# Patient Record
Sex: Female | Born: 1990 | Race: White | Hispanic: No | Marital: Married | Smoking: Never smoker
Health system: Southern US, Community
[De-identification: ages and names within clinical notes are randomized; demographics above are authoritative.]

## PROBLEM LIST (undated history)

## (undated) HISTORY — PX: OTHER SURGICAL HISTORY: SHX169

## (undated) HISTORY — PX: NO PAST SURGERIES: SHX2092

---

## 2020-06-29 ENCOUNTER — Ambulatory Visit: Admit: 2020-06-29 | Discharge status: Absent without leave | Payer: Self-pay

## 2020-06-29 ENCOUNTER — Ambulatory Visit
Admission: EM | Admit: 2020-06-29 | Discharge: 2020-06-29 | Disposition: A | Discharge status: Home / Self Care | Payer: BC Managed Care – PPO | Attending: Emergency Medicine | Admitting: Emergency Medicine

## 2020-06-29 ENCOUNTER — Encounter: Payer: Self-pay | Admitting: Emergency Medicine

## 2020-06-29 ENCOUNTER — Other Ambulatory Visit: Payer: Self-pay

## 2020-06-29 DIAGNOSIS — J029 Acute pharyngitis, unspecified: Secondary | ICD-10-CM | POA: Insufficient documentation

## 2020-06-29 DIAGNOSIS — Z20822 Contact with and (suspected) exposure to covid-19: Secondary | ICD-10-CM | POA: Diagnosis present

## 2020-06-29 LAB — GROUP A STREP BY PCR: Group A Strep by PCR: NOT DETECTED

## 2020-06-29 LAB — SARS CORONAVIRUS 2 (TAT 6-24 HRS): SARS Coronavirus 2: NEGATIVE

## 2020-06-29 MED ORDER — FLUTICASONE PROPIONATE 50 MCG/ACT NA SUSP: 2.0000 | Freq: Every day | NASAL | 0 refills | Status: DC

## 2020-06-29 MED ORDER — IBUPROFEN 600 MG PO TABS: 600.0000 mg | ORAL_TABLET | Freq: Four times a day (QID) | ORAL | 0 refills | Status: DC | PRN

## 2020-06-29 NOTE — ED Provider Notes (Signed)
HPI  SUBJECTIVE:  Patient reports sore throat starting 1 week ago.  States it is getting worse.. Sx worse with swallowing.  Nothing.  Has been taking ibuprofen 400 mg every 6 hours, drinking hot tea with honey.  States that she is unable to sleep at night secondary to the sore throat.  No fever   No neck stiffness  + Dry cough No nasal congestion, rhinorrhea No Myalgias No Headache No Rash No excess fatigue  No loss of taste or smell No shortness of breath or difficulty breathing No nausea, vomiting No diarrhea No abdominal pain     No Recent Strep, mono, COVID exposure.  She states that her husband has had similar symptoms for the past 2 weeks. No reflux sxs No Allergy sxs  No Breathing difficulty, sensation of throat swelling shut + Raspy voice No Drooling No Trismus No abx in past month.  No antipyretic in past 4-6 hrs Got both doses of the Covid vaccine, last one in April.   Past medical history negative for GERD, allergies, frequent strep, diabetes, hypertension, asthma.  She had mono in college.   LMP: Has an IUD.  Denies possibility pregnant.   PMD: [facility].Marland Kitchen   History reviewed. No pertinent past medical history.  History reviewed. No pertinent surgical history.  Family History  Problem Relation Age of Onset  . Cancer Father     Social History   Tobacco Use  . Smoking status: Never Smoker  . Smokeless tobacco: Never Used  Vaping Use  . Vaping Use: Never used  Substance Use Topics  . Alcohol use: Yes  . Drug use: Never    No current facility-administered medications for this encounter.  Current Outpatient Medications:  .  fluticasone (FLONASE) 50 MCG/ACT nasal spray, Place 2 sprays into both nostrils daily., Disp: 16 g, Rfl: 0 .  ibuprofen (ADVIL) 600 MG tablet, Take 1 tablet (600 mg total) by mouth every 6 (six) hours as needed., Disp: 30 tablet, Rfl: 0  Allergies  Allergen Reactions  . Sulfa Antibiotics     Throat swelling       ROS  As noted in HPI.   Physical Exam  BP 136/80 (BP Location: Right Arm)   Pulse 80   Temp 98.1 F (36.7 C) (Oral)   Resp 18   Ht 5\' 9"  (1.753 m)   Wt 81.6 kg   SpO2 100%   BMI 26.58 kg/m   Constitutional: Well developed, well nourished, no acute distress Eyes:  EOMI, conjunctiva normal bilaterally HENT: Normocephalic, atraumatic,mucus membranes moist.  - nasal congestion + erythematous oropharynx - enlarged tonsils - exudates. Uvula midline.  No petechiae on palate.  Positive postnasal drip Respiratory: Normal inspiratory effort lungs clear bilaterally Cardiovascular: Normal rate, no murmurs, rubs, gallops GI: nondistended, nontender. No appreciable splenomegaly skin: No rash, skin intact Lymph: - Anterior cervical LN.  No posterior cervical lymphadenopathy Musculoskeletal: no deformities Neurologic: Alert & oriented x 3, no focal neuro deficits Psychiatric: Speech and behavior appropriate.    ED Course   Medications - No data to display  Orders Placed This Encounter  Procedures  . SARS CORONAVIRUS 2 (TAT 6-24 HRS) Nasopharyngeal Nasopharyngeal Swab    Standing Status:   Standing    Number of Occurrences:   1    Order Specific Question:   Is this test for diagnosis or screening    Answer:   Diagnosis of ill patient    Order Specific Question:   Symptomatic for COVID-19 as defined  by CDC    Answer:   Yes    Order Specific Question:   Date of Symptom Onset    Answer:   06/22/2020    Order Specific Question:   Hospitalized for COVID-19    Answer:   No    Order Specific Question:   Admitted to ICU for COVID-19    Answer:   No    Order Specific Question:   Previously tested for COVID-19    Answer:   Yes    Order Specific Question:   Resident in a congregate (group) care setting    Answer:   No    Order Specific Question:   Employed in healthcare setting    Answer:   No    Order Specific Question:   Pregnant    Answer:   No    Order Specific Question:   Has  patient completed COVID vaccination(s) (2 doses of Pfizer/Moderna 1 dose of [employer])    Answer:   Yes  . Group A Strep by PCR    Standing Status:   Standing    Number of Occurrences:   1    Order Specific Question:   Patient immune status    Answer:   Normal   Results for orders placed or performed during the hospital encounter of 06/29/20  SARS CORONAVIRUS 2 (TAT 6-24 HRS) Nasopharyngeal Nasopharyngeal Swab   Specimen: Nasopharyngeal Swab  Result Value Ref Range   SARS Coronavirus 2 NEGATIVE NEGATIVE  Group A Strep by PCR   Specimen: Nasopharyngeal Swab; Sterile Swab  Result Value Ref Range   Group A Strep by PCR NOT DETECTED NOT DETECTED    No results found for this or any previous visit (from the past 24 hour(s)). No results found.  ED Clinical Impression  1. Pharyngitis, unspecified etiology   2. Encounter for laboratory testing for COVID-19 virus      ED Assessment/Plan  Strep PCR negative.  Covid sent.  She will be a candidate for monoclonal antibody infusion based on BMI  Covid negative  Suspect infectious cause given husband has identical symptoms rather than GERD.  Will treat supportively.  Benadryl/Maalox mixture, Flonase, saline nasal irrigation.  Follow-up with PMD as needed.  To the ER if she gets worse.  Discussed labs,  MDM, plan and followup with patient. Discussed sn/sx that should prompt return to the ED. patient agrees with plan.   Meds ordered this encounter  Medications  . fluticasone (FLONASE) 50 MCG/ACT nasal spray    Sig: Place 2 sprays into both nostrils daily.    Dispense:  16 g    Refill:  0  . ibuprofen (ADVIL) 600 MG tablet    Sig: Take 1 tablet (600 mg total) by mouth every 6 (six) hours as needed.    Dispense:  30 tablet    Refill:  0     *This clinic note was created using Scientist, clinical (histocompatibility and immunogenetics). Therefore, there may be occasional mistakes despite careful proofreading.    Domenick Gong, MD 06/30/20 1323

## 2020-06-29 NOTE — ED Triage Notes (Signed)
Patient c/o sore throat that started 1 week ago. She states the pain is keeping her awake at night. She does report a dry cough from when her throat is irritated.

## 2020-06-29 NOTE — Discharge Instructions (Addendum)
Contact you if your Covid comes back positive.  Try Flonase, saline nasal irrigation with a Lloyd Huger med rinse and distilled water as often as you want.  1 gram of Tylenol and 600 mg ibuprofen together 3-4 times a day as needed for pain.  Make sure you drink plenty of extra fluids.  Some people find salt water gargles and  Traditional Medicinal's "Throat Coat" tea helpful. Take 5 mL of liquid Benadryl and 5 mL of Maalox. Mix it together, and then hold it in your mouth for as long as you can and then swallow. You may do this 4 times a day.    Go to www.goodrx.com to look up your medications. This will give you a list of where you can find your prescriptions at the most affordable prices. Or ask the pharmacist what the cash price is, or if they have any other discount programs available to help make your medication more affordable. This can be less expensive than what you would pay with insurance.

## 2020-10-30 NOTE — L&D Delivery Note (Signed)
Delivery Note  Date of delivery: 05/23/2021 Estimated Date of Delivery: 06/03/21 No LMP recorded. Patient is pregnant. EGA: [redacted]w[redacted]d  Delivery Note At 1:58 PM a viable female was delivered via Vaginal, Spontaneous (Presentation: Left Occiput Anterior).  APGAR: 8, 9; weight 8 lb 12.7 oz (3990 g). Placenta status: Spontaneous, Intact.  Cord: 3 vessels with the following complications: None.  Cord pH: n/a   First Stage: Labor onset: 0710 Augmentation : pitocin Analgesia /Anesthesia intrapartum: Epidural SROM at 2330  Carol Frye presented to L&D with SROM. She was augmented with pitocin. Epidural placed for pain relief.   Second Stage: Complete dilation at 1314 Onset of pushing at 1314 FHR second stage Cat II Delivery at 1358 on 05/23/2021  She progressed to complete and had a spontaneous vaginal birth of a live female over an intact perineum. The fetal head was delivered in OA position with restitution to LOA. Loose nuchal cord noted and easiley reduced. Anterior then posterior shoulders delivered spontaneously. Baby placed on mom's abdomen and attended to by transition RN. Cord clamped and cut when pulseless by FOB.   Third Stage: Placenta delivered intact with 3VC at 1404  Placenta disposition: routine disposal Uterine tone firm / bleeding min IV pitocin given for hemorrhage prophylaxis  Anesthesia: Epidural Episiotomy: None Lacerations: 2nd degree;Vaginal;Perineal Suture Repair: 2.0 vicryl Est. Blood Loss (mL): 150  Complications: none  Mom to postpartum.  Baby to Couplet care / Skin to Skin.  Newborn: Birth Weight: 3900g 8lb12.7oz  Apgar Scores: 8, 9 Feeding planned: Breastfeeding   Cyril Mourning, CNM 05/23/2021 2:33 PM

## 2020-11-17 LAB — OB RESULTS CONSOLE HEPATITIS B SURFACE ANTIGEN: Hepatitis B Surface Ag: NEGATIVE

## 2020-11-17 LAB — OB RESULTS CONSOLE RUBELLA ANTIBODY, IGM: Rubella: IMMUNE

## 2020-11-17 LAB — OB RESULTS CONSOLE RPR: RPR: REACTIVE

## 2020-11-17 LAB — OB RESULTS CONSOLE HIV ANTIBODY (ROUTINE TESTING): HIV: NONREACTIVE

## 2020-11-17 LAB — OB RESULTS CONSOLE VARICELLA ZOSTER ANTIBODY, IGG: Varicella: IMMUNE

## 2021-04-30 ENCOUNTER — Observation Stay
Admission: EM | Admit: 2021-04-30 | Discharge: 2021-04-30 | Disposition: A | Discharge status: Home / Self Care | Payer: BC Managed Care – PPO | Attending: Certified Nurse Midwife | Admitting: Certified Nurse Midwife

## 2021-04-30 ENCOUNTER — Other Ambulatory Visit: Payer: Self-pay

## 2021-04-30 ENCOUNTER — Encounter: Payer: Self-pay | Admitting: Obstetrics and Gynecology

## 2021-04-30 DIAGNOSIS — Z3A35 35 weeks gestation of pregnancy: Secondary | ICD-10-CM | POA: Diagnosis not present

## 2021-04-30 DIAGNOSIS — O26893 Other specified pregnancy related conditions, third trimester: Secondary | ICD-10-CM | POA: Diagnosis present

## 2021-04-30 DIAGNOSIS — R109 Unspecified abdominal pain: Secondary | ICD-10-CM | POA: Diagnosis not present

## 2021-04-30 DIAGNOSIS — Z3490 Encounter for supervision of normal pregnancy, unspecified, unspecified trimester: Secondary | ICD-10-CM

## 2021-04-30 DIAGNOSIS — O4290 Premature rupture of membranes, unspecified as to length of time between rupture and onset of labor, unspecified weeks of gestation: Secondary | ICD-10-CM | POA: Diagnosis present

## 2021-04-30 LAB — WET PREP, GENITAL
Clue Cells Wet Prep HPF POC: NONE SEEN
Sperm: NONE SEEN
Trich, Wet Prep: NONE SEEN
Yeast Wet Prep HPF POC: NONE SEEN

## 2021-04-30 LAB — RUPTURE OF MEMBRANE (ROM)PLUS: Rom Plus: NEGATIVE

## 2021-04-30 NOTE — Discharge Summary (Signed)
Patient ID: Carol Frye MRN: [id number] DOB/AGE: 24-Feb-1991 30 y.o.  Admit date: 04/30/2021 Discharge date: 04/30/2021  Admission Diagnoses: 30yo G1P0 at [redacted]w[redacted]d presents for LOF and cramping  Factors complicating pregnancy: 1. Family history of congenital heart defect 2. RPR reactive   Discharge Diagnoses: AROM neg, wet prep neg, no cervical change  Prenatal Procedures: NST  Consults: None  Significant Diagnostic Studies:  Results for orders placed or performed during the hospital encounter of 04/30/21 (from the past 168 hour(s))  Wet prep, genital   Collection Time: 04/30/21 12:05 PM   Specimen: Vaginal  Result Value Ref Range   Yeast Wet Prep HPF POC NONE SEEN NONE SEEN   Trich, Wet Prep NONE SEEN NONE SEEN   Clue Cells Wet Prep HPF POC NONE SEEN NONE SEEN   WBC, Wet Prep HPF POC MANY (A) NONE SEEN   Sperm NONE SEEN   ROM Plus ([facility] only)   Collection Time: 04/30/21 12:05 PM  Result Value Ref Range   Rom Plus NEGATIVE     Treatments: none  Hospital Course:  This is a 30 y.o. G1P0 with IUP at [redacted]w[redacted]d seen for LOF and cramping, noted to have a cervical exam of FT/Long/High.  She was deemed stable for discharge to home with outpatient follow up.  Discharge Physical Exam:  BP 116/74   Pulse (!) 106   Resp 14   General: NAD CV: RRR Pulm: nl effort ABD: s/nd/nt, gravid DVT Evaluation: LE non-ttp, no evidence of DVT on exam.  NST: FHR baseline: 140 bpm Variability: moderate Accelerations: yes Decelerations: none Category/reactivity: reactive  TOCO: irregular  SVE:  Dilation: Fingertip Effacement (%): Thick Cervical Position: Posterior Station: Ballotable Exam by:: Annamary Rummage CNM   Discharge Condition: Stable  Disposition: Discharge disposition: 01-Home or Self Care       Allergies as of 04/30/2021       Reactions   Sulfa Antibiotics    Throat swelling   Mushroom Extract Complex Rash        Medication List     STOP taking these medications     fluticasone 50 MCG/ACT nasal spray Commonly known as: FLONASE   ibuprofen 600 MG tablet Commonly known as: ADVIL       TAKE these medications    prenatal multivitamin Tabs tablet Take 1 tablet by mouth daily at 12 noon.        Follow-up Information     [facility] Follow up.   Why: Keep all scheduled appointments Contact information: [address] [place] [address] [phone]                Signed:  Quillian Quince 04/30/2021 1:27 PM

## 2021-04-30 NOTE — Discharge Instructions (Signed)
Please keep your next scheduled appointment.  You may call the on call provider with questions or concerns.  If you have urgent concerns please go to the nearest emergency department for evaluation.

## 2021-04-30 NOTE — OB Triage Note (Addendum)
Patient noticed a wet spot where she had been sitting around 0845 this am.  She put a pad on and noted that it felt damp but was never soaked.  She denies any bleeding and reports baby is moving well as normal.  ROM plus was negative. Cervix check by provider 0.5/thick/high

## 2021-05-02 ENCOUNTER — Other Ambulatory Visit: Payer: Self-pay

## 2021-05-02 ENCOUNTER — Encounter: Payer: Self-pay | Admitting: Obstetrics and Gynecology

## 2021-05-02 ENCOUNTER — Observation Stay
Admission: EM | Admit: 2021-05-02 | Discharge: 2021-05-02 | Disposition: A | Discharge status: Home / Self Care | Payer: BC Managed Care – PPO | Attending: Certified Nurse Midwife | Admitting: Certified Nurse Midwife

## 2021-05-02 DIAGNOSIS — M545 Low back pain, unspecified: Secondary | ICD-10-CM | POA: Diagnosis not present

## 2021-05-02 DIAGNOSIS — O26893 Other specified pregnancy related conditions, third trimester: Principal | ICD-10-CM | POA: Insufficient documentation

## 2021-05-02 DIAGNOSIS — R102 Pelvic and perineal pain: Secondary | ICD-10-CM | POA: Diagnosis not present

## 2021-05-02 DIAGNOSIS — O479 False labor, unspecified: Secondary | ICD-10-CM | POA: Diagnosis present

## 2021-05-02 DIAGNOSIS — Z3A35 35 weeks gestation of pregnancy: Secondary | ICD-10-CM | POA: Diagnosis not present

## 2021-05-02 LAB — URINALYSIS, COMPLETE (UACMP) WITH MICROSCOPIC
Bilirubin Urine: NEGATIVE
Glucose, UA: NEGATIVE mg/dL
Hgb urine dipstick: NEGATIVE
Ketones, ur: NEGATIVE mg/dL
Nitrite: NEGATIVE
Protein, ur: NEGATIVE mg/dL
Specific Gravity, Urine: 1.004 — ABNORMAL LOW (ref 1.005–1.030)
pH: 7 (ref 5.0–8.0)

## 2021-05-02 LAB — GROUP B STREP BY PCR: Group B strep by PCR: NEGATIVE

## 2021-05-02 MED ORDER — ACETAMINOPHEN 325 MG PO TABS
650.0000 mg | ORAL_TABLET | ORAL | Status: DC | PRN
  Administered 2021-05-02: 650 mg via ORAL
  Filled 2021-05-02: qty 2

## 2021-05-02 NOTE — Discharge Summary (Signed)
Patient ID: Carol Frye MRN: [id number] DOB/AGE: [birth year] 28 y.o.  Admit date: 05/02/2021 Discharge date: 05/02/2021  Admission Diagnoses: 30yo G1P0 at [redacted]w[redacted]d presents for cramping in lower pelvis and lower back.  Factors complicating pregnancy: 1. Family history of congenital heart defect 2. RPR reactive   Discharge Diagnoses: not in active labor  Prenatal Procedures: NST  Consults: None  Significant Diagnostic Studies:  Results for orders placed or performed during the hospital encounter of 05/02/21 (from the past 168 hour(s))  Group B strep by PCR   Collection Time: 05/02/21 12:54 PM   Specimen: Vaginal/Rectal; Genital  Result Value Ref Range   Group B strep by PCR NEGATIVE NEGATIVE  Urinalysis, Complete w Microscopic Urine, Clean Catch   Collection Time: 05/02/21 12:54 PM  Result Value Ref Range   Color, Urine YELLOW (A) YELLOW   APPearance HAZY (A) CLEAR   Specific Gravity, Urine 1.004 (L) 1.005 - 1.030   pH 7.0 5.0 - 8.0   Glucose, UA NEGATIVE NEGATIVE mg/dL   Hgb urine dipstick NEGATIVE NEGATIVE   Bilirubin Urine NEGATIVE NEGATIVE   Ketones, ur NEGATIVE NEGATIVE mg/dL   Protein, ur NEGATIVE NEGATIVE mg/dL   Nitrite NEGATIVE NEGATIVE   Leukocytes,Ua LARGE (A) NEGATIVE   RBC / HPF 0-5 0 - 5 RBC/hpf   WBC, UA 11-20 0 - 5 WBC/hpf   Bacteria, UA MANY (A) NONE SEEN   Squamous Epithelial / LPF 0-5 0 - 5   Mucus PRESENT   Results for orders placed or performed during the hospital encounter of 04/30/21 (from the past 168 hour(s))  Wet prep, genital   Collection Time: 04/30/21 12:05 PM   Specimen: Vaginal  Result Value Ref Range   Yeast Wet Prep HPF POC NONE SEEN NONE SEEN   Trich, Wet Prep NONE SEEN NONE SEEN   Clue Cells Wet Prep HPF POC NONE SEEN NONE SEEN   WBC, Wet Prep HPF POC MANY (A) NONE SEEN   Sperm NONE SEEN   ROM Plus ([facility] only)   Collection Time: 04/30/21 12:05 PM  Result Value Ref Range   Rom Plus NEGATIVE     Treatments: Tylenol  Hospital Course:   This is a 30 y.o. G1P0 with IUP at [redacted]w[redacted]d seen for cramping in lower pelvis and lower back. No leaking of fluid and no bleeding.  She was observed, fetal heart rate monitoring remained reassuring, and she had no signs/symptoms of progressing preterm labor or other maternal-fetal concerns.  Her cervical exam was unchanged from admission.  She was deemed stable for discharge to home with outpatient follow up  Discharge Physical Exam:  BP 122/77   Pulse (!) 102   Temp 98 F (36.7 C) (Oral)   Resp 17   Ht 5\' 9"  (1.753 m)   Wt 103.4 kg   BMI 33.67 kg/m   General: NAD CV: RRR Pulm: nl effort ABD: s/nd/nt, gravid DVT Evaluation: LE non-ttp, no evidence of DVT on exam.  NST: FHR baseline: 120 bpm Variability: moderate Accelerations: yes Decelerations: none Category/reactivity: reactive  TOCO: irregular  SVE:  Dilation: Fingertip Effacement (%): Thick Cervical Position: Posterior Station: Ballotable Exam by:: J.Javeah Loeza, CNM   Discharge Condition: Stable  Disposition:  Discharge disposition: 01-Home or Self Care       Allergies as of 05/02/2021       Reactions   Sulfa Antibiotics    Throat swelling   Mushroom Extract Complex Rash        Medication List     TAKE  these medications    prenatal multivitamin Tabs tablet Take 1 tablet by mouth daily at 12 noon.         SignedHaroldine Laws, CNM 05/02/2021 4:31 PM

## 2021-05-02 NOTE — OB Triage Note (Signed)
Patient presented to L&D with complaints of contractions since Saturday. Patient reports they have gotten more uncomfortable this morning. Denies recent intercourse.  No leaking fluid, no vaginal bleeding and reports normal fetal movement.

## 2021-05-23 ENCOUNTER — Other Ambulatory Visit: Payer: Self-pay

## 2021-05-23 ENCOUNTER — Inpatient Hospital Stay: Payer: BC Managed Care – PPO | Admitting: Certified Registered"

## 2021-05-23 ENCOUNTER — Encounter: Payer: Self-pay | Admitting: Obstetrics and Gynecology

## 2021-05-23 ENCOUNTER — Inpatient Hospital Stay
Admission: EM | Admit: 2021-05-23 | Discharge: 2021-05-25 | DRG: 806 | Disposition: A | Discharge status: Home / Self Care | Payer: BC Managed Care – PPO | Attending: Obstetrics and Gynecology | Admitting: Obstetrics and Gynecology

## 2021-05-23 DIAGNOSIS — D62 Acute posthemorrhagic anemia: Secondary | ICD-10-CM | POA: Diagnosis not present

## 2021-05-23 DIAGNOSIS — Z3A38 38 weeks gestation of pregnancy: Secondary | ICD-10-CM

## 2021-05-23 DIAGNOSIS — O26893 Other specified pregnancy related conditions, third trimester: Secondary | ICD-10-CM | POA: Diagnosis present

## 2021-05-23 DIAGNOSIS — O9081 Anemia of the puerperium: Secondary | ICD-10-CM | POA: Diagnosis not present

## 2021-05-23 DIAGNOSIS — O4290 Premature rupture of membranes, unspecified as to length of time between rupture and onset of labor, unspecified weeks of gestation: Secondary | ICD-10-CM | POA: Diagnosis present

## 2021-05-23 DIAGNOSIS — Z20822 Contact with and (suspected) exposure to covid-19: Secondary | ICD-10-CM | POA: Diagnosis present

## 2021-05-23 LAB — CBC
HCT: 37 % (ref 36.0–46.0)
Hemoglobin: 12.6 g/dL (ref 12.0–15.0)
MCH: 29 pg (ref 26.0–34.0)
MCHC: 34.1 g/dL (ref 30.0–36.0)
MCV: 85.3 fL (ref 80.0–100.0)
Platelets: 210 10*3/uL (ref 150–400)
RBC: 4.34 MIL/uL (ref 3.87–5.11)
RDW: 13.5 % (ref 11.5–15.5)
WBC: 12.5 10*3/uL — ABNORMAL HIGH (ref 4.0–10.5)
nRBC: 0 % (ref 0.0–0.2)

## 2021-05-23 LAB — SAMPLE TO BLOOD BANK

## 2021-05-23 LAB — RESP PANEL BY RT-PCR (FLU A&B, COVID) ARPGX2
Influenza A by PCR: NEGATIVE
Influenza B by PCR: NEGATIVE
SARS Coronavirus 2 by RT PCR: NEGATIVE

## 2021-05-23 MED ORDER — WITCH HAZEL-GLYCERIN EX PADS
1.0000 "application " | MEDICATED_PAD | CUTANEOUS | Status: DC | PRN
  Administered 2021-05-23: 1 via TOPICAL
  Filled 2021-05-23: qty 100

## 2021-05-23 MED ORDER — LIDOCAINE HCL (PF) 1 % IJ SOLN
INTRAMUSCULAR | Status: AC
  Filled 2021-05-23: qty 30

## 2021-05-23 MED ORDER — BUPIVACAINE HCL (PF) 0.25 % IJ SOLN
INTRAMUSCULAR | Status: DC | PRN
  Administered 2021-05-23: 5 mL via EPIDURAL

## 2021-05-23 MED ORDER — ACETAMINOPHEN 325 MG PO TABS
650.0000 mg | ORAL_TABLET | ORAL | Status: DC | PRN
  Administered 2021-05-24 (×2): 650 mg via ORAL
  Filled 2021-05-23 (×2): qty 2

## 2021-05-23 MED ORDER — OXYTOCIN-SODIUM CHLORIDE 30-0.9 UT/500ML-% IV SOLN
1.0000 m[IU]/min | INTRAVENOUS | Status: DC
  Administered 2021-05-23: 1 m[IU]/min via INTRAVENOUS

## 2021-05-23 MED ORDER — SOD CITRATE-CITRIC ACID 500-334 MG/5ML PO SOLN: 30.0000 mL | ORAL | Status: DC | PRN

## 2021-05-23 MED ORDER — ONDANSETRON HCL 4 MG/2ML IJ SOLN
4.0000 mg | Freq: Four times a day (QID) | INTRAMUSCULAR | Status: DC
  Administered 2021-05-23: 4 mg via INTRAVENOUS

## 2021-05-23 MED ORDER — TETANUS-DIPHTH-ACELL PERTUSSIS 5-2.5-18.5 LF-MCG/0.5 IM SUSY: 0.5000 mL | PREFILLED_SYRINGE | Freq: Once | INTRAMUSCULAR | Status: DC

## 2021-05-23 MED ORDER — OXYTOCIN-SODIUM CHLORIDE 30-0.9 UT/500ML-% IV SOLN
INTRAVENOUS | Status: AC
  Filled 2021-05-23: qty 500

## 2021-05-23 MED ORDER — BENZOCAINE-MENTHOL 20-0.5 % EX AERO
1.0000 "application " | INHALATION_SPRAY | CUTANEOUS | Status: DC | PRN
  Administered 2021-05-23: 1 via TOPICAL

## 2021-05-23 MED ORDER — DIPHENHYDRAMINE HCL 25 MG PO CAPS: 25.0000 mg | ORAL_CAPSULE | Freq: Four times a day (QID) | ORAL | Status: DC | PRN

## 2021-05-23 MED ORDER — FENTANYL 2.5 MCG/ML W/ROPIVACAINE 0.15% IN NS 100 ML EPIDURAL (ARMC)
EPIDURAL | Status: DC | PRN
  Administered 2021-05-23: 12 mL/h via EPIDURAL

## 2021-05-23 MED ORDER — OXYCODONE-ACETAMINOPHEN 5-325 MG PO TABS: 2.0000 | ORAL_TABLET | ORAL | Status: DC | PRN

## 2021-05-23 MED ORDER — ONDANSETRON HCL 4 MG/2ML IJ SOLN
INTRAMUSCULAR | Status: AC
  Filled 2021-05-23: qty 2

## 2021-05-23 MED ORDER — LACTATED RINGERS IV SOLN: 500.0000 mL | Freq: Once | INTRAVENOUS | Status: DC

## 2021-05-23 MED ORDER — ONDANSETRON HCL 4 MG/2ML IJ SOLN: 4.0000 mg | INTRAMUSCULAR | Status: DC | PRN

## 2021-05-23 MED ORDER — PHENYLEPHRINE 40 MCG/ML (10ML) SYRINGE FOR IV PUSH (FOR BLOOD PRESSURE SUPPORT): 80.0000 ug | PREFILLED_SYRINGE | INTRAVENOUS | Status: DC | PRN

## 2021-05-23 MED ORDER — DIBUCAINE (PERIANAL) 1 % EX OINT
TOPICAL_OINTMENT | CUTANEOUS | Status: AC
  Filled 2021-05-23: qty 28

## 2021-05-23 MED ORDER — ACETAMINOPHEN 325 MG PO TABS: 650.0000 mg | ORAL_TABLET | ORAL | Status: DC | PRN

## 2021-05-23 MED ORDER — FERROUS SULFATE 325 (65 FE) MG PO TABS
325.0000 mg | ORAL_TABLET | Freq: Two times a day (BID) | ORAL | Status: DC
Start: 2021-05-23 — End: 2021-05-25
  Administered 2021-05-23 – 2021-05-25 (×4): 325 mg via ORAL
  Filled 2021-05-23 (×4): qty 1

## 2021-05-23 MED ORDER — MISOPROSTOL 200 MCG PO TABS
ORAL_TABLET | ORAL | Status: AC
  Filled 2021-05-23: qty 4

## 2021-05-23 MED ORDER — DOCUSATE SODIUM 100 MG PO CAPS
100.0000 mg | ORAL_CAPSULE | Freq: Two times a day (BID) | ORAL | Status: DC
  Administered 2021-05-24 – 2021-05-25 (×3): 100 mg via ORAL
  Filled 2021-05-23 (×4): qty 1

## 2021-05-23 MED ORDER — FENTANYL 2.5 MCG/ML W/ROPIVACAINE 0.15% IN NS 100 ML EPIDURAL (ARMC)
EPIDURAL | Status: AC
  Filled 2021-05-23: qty 100

## 2021-05-23 MED ORDER — LACTATED RINGERS IV SOLN: INTRAVENOUS | Status: DC

## 2021-05-23 MED ORDER — IBUPROFEN 600 MG PO TABS
600.0000 mg | ORAL_TABLET | Freq: Four times a day (QID) | ORAL | Status: DC
Start: 2021-05-23 — End: 2021-05-25
  Administered 2021-05-23 – 2021-05-25 (×7): 600 mg via ORAL
  Filled 2021-05-23 (×7): qty 1

## 2021-05-23 MED ORDER — DIBUCAINE (PERIANAL) 1 % EX OINT
1.0000 "application " | TOPICAL_OINTMENT | CUTANEOUS | Status: DC | PRN
  Filled 2021-05-23: qty 28

## 2021-05-23 MED ORDER — SODIUM CHLORIDE 0.9 % IV SOLN
INTRAVENOUS | Status: DC | PRN
  Administered 2021-05-23: 10 ug via INTRAVENOUS

## 2021-05-23 MED ORDER — OXYCODONE-ACETAMINOPHEN 5-325 MG PO TABS: 1.0000 | ORAL_TABLET | ORAL | Status: DC | PRN

## 2021-05-23 MED ORDER — EPHEDRINE 5 MG/ML INJ
10.0000 mg | INTRAVENOUS | Status: DC | PRN
  Administered 2021-05-23: 10 mg via INTRAVENOUS

## 2021-05-23 MED ORDER — LIDOCAINE HCL (PF) 1 % IJ SOLN
INTRAMUSCULAR | Status: DC | PRN
  Administered 2021-05-23: 3 mL

## 2021-05-23 MED ORDER — OXYCODONE HCL 5 MG PO TABS: 5.0000 mg | ORAL_TABLET | ORAL | Status: DC | PRN

## 2021-05-23 MED ORDER — EPHEDRINE 5 MG/ML INJ
10.0000 mg | INTRAVENOUS | Status: DC | PRN
  Filled 2021-05-23: qty 4

## 2021-05-23 MED ORDER — OXYTOCIN 10 UNIT/ML IJ SOLN
INTRAMUSCULAR | Status: AC
  Filled 2021-05-23: qty 2

## 2021-05-23 MED ORDER — AMMONIA AROMATIC IN INHA
RESPIRATORY_TRACT | Status: AC
  Filled 2021-05-23: qty 10

## 2021-05-23 MED ORDER — WITCH HAZEL-GLYCERIN EX PADS
MEDICATED_PAD | CUTANEOUS | Status: AC
  Filled 2021-05-23: qty 100

## 2021-05-23 MED ORDER — DIPHENHYDRAMINE HCL 50 MG/ML IJ SOLN
12.5000 mg | INTRAMUSCULAR | Status: DC | PRN
Start: 2021-05-23 — End: 2021-05-23

## 2021-05-23 MED ORDER — OXYTOCIN BOLUS FROM INFUSION
333.0000 mL | Freq: Once | INTRAVENOUS | Status: AC
  Administered 2021-05-23: 333 mL via INTRAVENOUS

## 2021-05-23 MED ORDER — LACTATED RINGERS IV BOLUS
500.0000 mL | INTRAVENOUS | Status: DC | PRN
  Administered 2021-05-23: 500 mL via INTRAVENOUS

## 2021-05-23 MED ORDER — OXYCODONE HCL 5 MG PO TABS: 10.0000 mg | ORAL_TABLET | ORAL | Status: DC | PRN

## 2021-05-23 MED ORDER — SIMETHICONE 80 MG PO CHEW: 80.0000 mg | CHEWABLE_TABLET | ORAL | Status: DC | PRN

## 2021-05-23 MED ORDER — COCONUT OIL OIL
1.0000 | TOPICAL_OIL | Status: DC | PRN
Start: 2021-05-23 — End: 2021-05-25

## 2021-05-23 MED ORDER — LIDOCAINE-EPINEPHRINE (PF) 1.5 %-1:200000 IJ SOLN
INTRAMUSCULAR | Status: DC | PRN
Start: 2021-05-23 — End: 2021-05-23
  Administered 2021-05-23: 3 mL via PERINEURAL

## 2021-05-23 MED ORDER — PRENATAL MULTIVITAMIN CH
1.0000 | ORAL_TABLET | Freq: Every day | ORAL | Status: DC
Start: 2021-05-24 — End: 2021-05-25
  Administered 2021-05-24: 1 via ORAL
  Filled 2021-05-23: qty 1

## 2021-05-23 MED ORDER — FENTANYL-BUPIVACAINE-NACL 0.5-0.125-0.9 MG/250ML-% EP SOLN: 12.0000 mL/h | EPIDURAL | Status: DC | PRN

## 2021-05-23 MED ORDER — ONDANSETRON HCL 4 MG PO TABS: 4.0000 mg | ORAL_TABLET | ORAL | Status: DC | PRN

## 2021-05-23 MED ORDER — LIDOCAINE HCL (PF) 1 % IJ SOLN
30.0000 mL | INTRAMUSCULAR | Status: DC | PRN
Start: 2021-05-23 — End: 2021-05-23

## 2021-05-23 MED ORDER — OXYTOCIN-SODIUM CHLORIDE 30-0.9 UT/500ML-% IV SOLN
2.5000 [IU]/h | INTRAVENOUS | Status: DC
  Filled 2021-05-23: qty 500

## 2021-05-23 MED ORDER — OXYTOCIN-SODIUM CHLORIDE 30-0.9 UT/500ML-% IV SOLN: 1.0000 m[IU]/min | INTRAVENOUS | Status: DC

## 2021-05-23 MED ORDER — BENZOCAINE-MENTHOL 20-0.5 % EX AERO
INHALATION_SPRAY | CUTANEOUS | Status: AC
  Filled 2021-05-23: qty 56

## 2021-05-23 MED ORDER — TERBUTALINE SULFATE 1 MG/ML IJ SOLN
0.2500 mg | Freq: Once | INTRAMUSCULAR | Status: DC | PRN
  Filled 2021-05-23: qty 1

## 2021-05-23 NOTE — OB Triage Note (Signed)
Patient here for LOF, felt large gush at 1120pm, denies bleeding, reports some cramping and pressure.

## 2021-05-23 NOTE — H&P (Signed)
OB History & Physical   History of Present Illness:  Chief Complaint:   HPI:  Carol Frye is a 30 y.o. G1P0 female at [redacted]w[redacted]d dated by LMP of 08/27/20.  She presents to L&D for leaking fluid.  She reports:  -active fetal movement -LOF/SROM at 2320 -no vaginal bleeding -onset of contractions at 2330 currently every 2-7 minutes  Pregnancy Issues: 1. Family history of congenital heart defect 2. RPR reactive   Ratio 1:1, T. Pallidum non-reactive   03/09/21: RPR: non- reactive   Maternal Medical History:  History reviewed. No pertinent past medical history.  Past Surgical History:  Procedure Laterality Date   NO PAST SURGERIES     OTHER SURGICAL HISTORY     patient was put to sleep for suture repair of face after dog attack at age of 7 years    Allergies  Allergen Reactions   Sulfa Antibiotics     Throat swelling    Mushroom Extract Complex Rash    Prior to Admission medications   Medication Sig Start Date End Date Taking? Authorizing Provider  Prenatal Vit-Fe Fumarate-FA (PRENATAL MULTIVITAMIN) TABS tablet Take 1 tablet by mouth daily at 12 noon.   Yes [provider]     Prenatal care site: [facility]  Social History: She  reports that she has never smoked. She has never used smokeless tobacco. She reports current alcohol use. She reports that she does not use drugs.  Family History: family history includes Cancer in her father.   Review of Systems: A full review of systems was performed and negative except as noted in the HPI.    Physical Exam:  Vital Signs: BP 122/73   Pulse 79   Temp 97.9 F (36.6 C) (Oral)   Resp 18   Ht 5\' 9"  (1.753 m)   Wt 109.3 kg   SpO2 100%   BMI 35.59 kg/m   General:   alert and cooperative  Skin:  normal  Neurologic:    Alert & oriented x 3  Lungs:    Nl effort  Heart:   regular rate and rhythm  Abdomen:  soft, non-tender; bowel sounds normal; no masses,  no organomegaly  Extremities: : non-tender,  symmetric, min edema bilaterally.       Results for orders placed or performed during the hospital encounter of 05/23/21 (from the past 24 hour(s))  Resp Panel by RT-PCR (Flu A&B, Covid) Nasopharyngeal Swab     Status: None   Collection Time: 05/23/21 12:33 AM   Specimen: Nasopharyngeal Swab; Nasopharyngeal(NP) swabs in vial transport medium  Result Value Ref Range   SARS Coronavirus 2 by RT PCR NEGATIVE NEGATIVE   Influenza A by PCR NEGATIVE NEGATIVE   Influenza B by PCR NEGATIVE NEGATIVE  CBC     Status: Abnormal   Collection Time: 05/23/21 12:33 AM  Result Value Ref Range   WBC 12.5 (H) 4.0 - 10.5 K/uL   RBC 4.34 3.87 - 5.11 MIL/uL   Hemoglobin 12.6 12.0 - 15.0 g/dL   HCT 05/25/21 21.1 - 94.1 %   MCV 85.3 80.0 - 100.0 fL   MCH 29.0 26.0 - 34.0 pg   MCHC 34.1 30.0 - 36.0 g/dL   RDW 74.0 81.4 - 48.1 %   Platelets 210 150 - 400 K/uL   nRBC 0.0 0.0 - 0.2 %  ABO/Rh     Status: None   Collection Time: 05/23/21 12:33 AM  Result Value Ref Range   ABO/RH(D)  A POS Performed at [facility], [address] [place], [place] [address]     Pertinent Results:  Prenatal Labs: Blood type/Rh A pos  Antibody screen neg  Rubella Immune  Varicella Immune  RPR RPR reactive - ratio 1:1, T. Pallidium non-reactive, 03/09/21: RPR: non- reactive  HBsAg Neg  HIV NR  GC neg  Chlamydia neg  Genetic screening negative  1 hour GTT 158  3 hour GTT 87-186-150-130  GBS Neg   FHT: FHR: 135 bpm, variability: moderate,  accelerations:  Present,  decelerations:  Absent Category/reactivity:  Category I TOCO: irregular, every 2-7 minutes SVE: Dilation: 4 / Effacement (%): 60 / Station: -2     Assessment:  Carol Frye is a 30 y.o. G1P0 female at [redacted]w[redacted]d with leaking fluid.   Plan:  1. Admit to Labor & Delivery; consents reviewed and obtained  2. Fetal Well being  - Fetal Tracing: Cat I - GBSneg - Presentation: VTX confirmed by SVE   3. Routine OB: - Prenatal labs reviewed, as  above - Rh pos - CBC & T&S on admit - Clear fluids, IVF  4. Induction of Labor -  Contractions by external toco in place -  Plan for induction with Pitocin -  Plan for continuous fetal monitoring  -  Maternal pain control as desired: IVPM, nitrous, regional anesthesia - Anticipate vaginal delivery  5. Post Partum Planning: - Infant feeding: Breastfeeding - Contraception: Condoms - Tdap Given 03/09/21 - Flu  Given 12/15/2020  Carol Frye, CNM 05/23/2021 8:23 AM

## 2021-05-23 NOTE — Discharge Summary (Signed)
Obstetrical Discharge Summary  Patient Name: Carol Frye DOB: 1990/11/06 MRN: [id number]  Date of Admission: 05/23/2021 Date of Delivery: 05/23/21 Delivered by: Annamary Rummage CNM Date of Discharge: 05/25/2021  Primary OB: [facility] LMP:No LMP recorded. EDC Estimated Date of Delivery: 06/03/21 Gestational Age at Delivery: [redacted]w[redacted]d   Antepartum complications: None Admitting Diagnosis: SROM Secondary Diagnosis: Patient Active Problem List   Diagnosis Date Noted   Indication for care in labor or delivery 05/23/2021   Uterine contractions 05/02/2021   Amniotic fluid leaking 04/30/2021   Pregnancy 04/30/2021    Augmentation: Pitocin Complications: None  Intrapartum complications/course: see delivery note Delivery Type: spontaneous vaginal delivery Anesthesia: epidural Placenta: spontaneous Laceration: 2nd deg lac Episiotomy: none Newborn Data: Live born female "Carol Frye" Birth Weight:  8#13 APGAR: 8, 9  Newborn Delivery   Birth date/time: 05/23/2021 13:58:00 Delivery type: Vaginal, Spontaneous      30 y.o. G1P0 at [redacted]w[redacted]d presenting with LOF, SROM with clear fluid.  She progressed to complete and pushed over an intact perineum and delivered the fetal head, followed promptly by the shoulders. She was in control the whole time, and the baby placed on the maternal abdomen. Delayed cord clamping and the FOB cut the baby's cord, while the baby was skin to skin. The placenta delivered spontaneously and intact. 2nd degree laceration that was repaired in the standard fashion. Mom and baby tolerated the procedure well.   Postpartum Procedures: None  Post partum course:  Patient had an uncomplicated postpartum course.  By time of discharge on PPD#2, her pain was controlled on oral pain medications; she had appropriate lochia and was ambulating, voiding without difficulty and tolerating regular diet.  She was deemed stable for discharge to home.    Discharge Physical Exam:  BP 114/67    Pulse 68   Temp 98.2 F (36.8 C) (Oral)   Resp 20   Ht 5\' 9"  (1.753 m)   Wt 109.3 kg   SpO2 99%   Breastfeeding Unknown   BMI 35.59 kg/m   General: alert and no distress Pulm: normal respiratory effort Lochia: appropriate Abdomen: soft, NT Uterine Fundus: firm, below umbilicus Extremities: No evidence of DVT seen on physical exam. No lower extremity edema.  Edinburgh Postnatal Depression Scale Screening Tool 05/23/2021 05/23/2021  I have been able to laugh and see the funny side of things. 0 (No Data)  I have looked forward with enjoyment to things. 0 -  I have blamed myself unnecessarily when things went wrong. 1 -  I have been anxious or worried for no good reason. 0 -  I have felt scared or panicky for no good reason. 0 -  Things have been getting on top of me. 0 -  I have been so unhappy that I have had difficulty sleeping. 0 -  I have felt sad or miserable. 0 -  I have been so unhappy that I have been crying. 0 -  The thought of harming myself has occurred to me. 0 -  Edinburgh Postnatal Depression Scale Total 1 -     Labs: CBC Latest Ref Rng & Units 05/24/2021 05/23/2021  WBC 4.0 - 10.5 K/uL 13.3(H) 12.5(H)  Hemoglobin 12.0 - 15.0 g/dL 10.8(L) 12.6  Hematocrit 36.0 - 46.0 % 31.7(L) 37.0  Platelets 150 - 400 K/uL 148(L) 210   A POS  Hemoglobin  Date Value Ref Range Status  05/24/2021 10.8 (L) 12.0 - 15.0 g/dL Final   HCT  Date Value Ref Range Status  05/24/2021 31.7 (L) 36.0 - 46.0 % Final    Disposition: stable, discharge to home Baby Feeding: breastmilk Baby Disposition: home with mom  Contraception: condoms  Prenatal Labs:  Blood type/Rh A pos  Antibody screen neg  Rubella Immune  Varicella Immune  RPR RPR reactive - ratio 1:1, T. Pallidium non-reactive, 03/09/21: RPR: non- reactive  HBsAg Neg  HIV NR  GC neg  Chlamydia neg  Genetic screening negative  1 hour GTT 158  3 hour GTT 87-186-150-130  GBS Neg   Rh Immune globulin given:  n/a Rubella vaccine given: Immune Varicella vaccine given: Immune Tdap vaccine given in AP or PP setting: Given 03/09/21 Flu vaccine given in AP or PP setting: Given 12/15/2020  Plan: Terrina Docter was discharged to home in good condition. Follow-up appointment with delivering provider in 6 weeks.  Discharge Instructions: Per After Visit Summary. Activity: Advance as tolerated. Pelvic rest for 6 weeks.   Diet: Regular Discharge Medications: Allergies as of 05/25/2021       Reactions   Sulfa Antibiotics    Throat swelling   Mushroom Extract Complex Rash        Medication List     TAKE these medications    prenatal multivitamin Tabs tablet Take 1 tablet by mouth daily at 12 noon.       Outpatient follow up:   Follow-up Information     [facility]. Schedule an appointment as soon as possible for a visit in 6 week(s).   Contact information: [address] [place] [address] [phone]                Signed: Cyril Mourning  05/25/2021 8:10 AM

## 2021-05-23 NOTE — Anesthesia Preprocedure Evaluation (Signed)
Anesthesia Evaluation  Patient identified by MRN, date of birth, ID band Patient awake    History of Anesthesia Complications Negative for: history of anesthetic complications  Airway Mallampati: I  TM Distance: >3 FB     Dental  (+) Teeth Intact   Pulmonary neg pulmonary ROS,           Cardiovascular negative cardio ROS   Rhythm:Regular Rate:Tachycardia     Neuro/Psych negative neurological ROS  negative psych ROS   GI/Hepatic Neg liver ROS, GERD  ,  Endo/Other  negative endocrine ROS  Renal/GU negative Renal ROS  negative genitourinary   Musculoskeletal negative musculoskeletal ROS (+)   Abdominal   Peds negative pediatric ROS (+)  Hematology   Anesthesia Other Findings   Reproductive/Obstetrics (+) Pregnancy                             Anesthesia Physical Anesthesia Plan  ASA: 2  Anesthesia Plan: Epidural   Post-op Pain Management:    Induction:   PONV Risk Score and Plan:   Airway Management Planned:   Additional Equipment:   Intra-op Plan:   Post-operative Plan:   Informed Consent: I have reviewed the patients History and Physical, chart, labs and discussed the procedure including the risks, benefits and alternatives for the proposed anesthesia with the patient or authorized representative who has indicated his/her understanding and acceptance.       Plan Discussed with: Anesthesiologist and Surgeon  Anesthesia Plan Comments:         Anesthesia Quick Evaluation

## 2021-05-23 NOTE — Anesthesia Procedure Notes (Signed)
Epidural Patient location during procedure: OB Start time: 05/23/2021 7:24 AM  Staffing Performed: resident/CRNA   Preanesthetic Checklist Completed: patient identified, IV checked, site marked, risks and benefits discussed, surgical consent, monitors and equipment checked, pre-op evaluation and timeout performed  Epidural Patient position: sitting Prep: ChloraPrep Patient monitoring: heart rate and blood pressure Approach: midline Location: L2-L3 Injection technique: LOR saline  Needle:  Needle type: Hustead  Needle gauge: 17 G Needle length: 9 cm Needle insertion depth: 8 cm Catheter type: closed end flexible Catheter at skin depth: 13 cm Test dose: negative and 2% lidocaine with Epi 1:200 K  Assessment Events: blood not aspirated, injection not painful, no injection resistance, no paresthesia and negative IV test  Additional Notes Pt tolerated well.  Attempt x1.  Pain score pre 8/post 0

## 2021-05-23 NOTE — Progress Notes (Addendum)
RN to bedside at 0545 with K Allred RN. FHR in the 50's. Position changed to left lateral. RN continuously adjusting [place]. Position changed to right lateral at 0546. Pitocin discontinued at 0546. IV fluid bolus initiated at 0547. H Cahoon RN to bedside to assist at [phone] Position changed to hands and knees, SVE by RN at 0549 3.5/60/-1. Scalp stimulation negative. Position changed to left lateral. [place] continuously adjusted. FHR remains in the 80's, Beasley MD notified at [phone] and in route. RN remains at bedside until [phone] with FHR in the 120's. Pt education provided and questions answered by RN.

## 2021-05-23 NOTE — Progress Notes (Signed)
Labor Progress Note  Carol Frye is a 30 y.o. G1P0 at [redacted]w[redacted]d by LMP admitted for rupture of membranes  Subjective: Carol Frye called out with vaginal pressure  Objective: BP (!) 101/56   Pulse (!) 103   Temp 98.9 F (37.2 C) (Oral)   Resp 18   Ht 5\' 9"  (1.753 m)   Wt 109.3 kg   SpO2 99%   BMI 35.59 kg/m   Fetal Assessment: FHT:  FHR: 140 bpm, variability: moderate,  accelerations:  Present,  decelerations:  Absent Category/reactivity:  Category I UC:   regular, every 2-4 minutes SVE:    Dilation: complete  Effacement: 100%  Station:  +1  Consistency: ---  Position: ---  Membrane status:SROM at 2330 Amniotic color: Clear  Labs: Lab Results  Component Value Date   WBC 12.5 (H) 05/23/2021   HGB 12.6 05/23/2021   HCT 37.0 05/23/2021   MCV 85.3 05/23/2021   PLT 210 05/23/2021    Assessment / Plan: Augmentation of labor, progressing well, complete will start practice pushing  Labor: Progressing on Pitocin Preeclampsia:   101/56 Fetal Wellbeing:  Category I Pain Control:  Epidural I/D:   Afebrile, GBS neg, SROM x 14hr Anticipated MOD:  NSVD  Jenifer E Raffi Milstein, CNM 05/23/2021, 1:25 PM

## 2021-05-24 LAB — TYPE AND SCREEN
ABO/RH(D): A POS
Antibody Screen: NEGATIVE

## 2021-05-24 LAB — CBC
HCT: 31.7 % — ABNORMAL LOW (ref 36.0–46.0)
Hemoglobin: 10.8 g/dL — ABNORMAL LOW (ref 12.0–15.0)
MCH: 29.3 pg (ref 26.0–34.0)
MCHC: 34.1 g/dL (ref 30.0–36.0)
MCV: 85.9 fL (ref 80.0–100.0)
Platelets: 148 10*3/uL — ABNORMAL LOW (ref 150–400)
RBC: 3.69 MIL/uL — ABNORMAL LOW (ref 3.87–5.11)
RDW: 13.8 % (ref 11.5–15.5)
WBC: 13.3 10*3/uL — ABNORMAL HIGH (ref 4.0–10.5)
nRBC: 0 % (ref 0.0–0.2)

## 2021-05-24 NOTE — Progress Notes (Signed)
Post Partum Day 1 Subjective: Doing well, no complaints.  Tolerating regular diet, pain with PO meds, voiding and ambulating without difficulty.  No CP SOB Fever,Chills, N/V or leg pain; denies nipple or breast pain, no HA change of vision, RUQ/epigastric pain  Objective: BP 108/69 (BP Location: Right Arm)   Pulse 63   Temp 97.8 F (36.6 C) (Oral)   Resp 18   Ht 5\' 9"  (1.753 m)   Wt 109.3 kg   SpO2 99%   Breastfeeding Unknown   BMI 35.59 kg/m    Physical Exam:  General: NAD Breasts: soft/nontender CV: RRR Pulm: nl effort, CTABL Abdomen: soft, NT, BS x 4 Perineum: minimal edema, intact/lacerations hemostatic/repair well approximated Lochia: small Uterine Fundus: fundus firm and 1 fb below umbilicus DVT Evaluation: no cords, ttp LEs   Recent Labs    05/23/21 0033 05/24/21 0618  HGB 12.6 10.8*  HCT 37.0 31.7*  WBC 12.5* 13.3*  PLT 210 148*    Assessment/Plan: 30 y.o. G1P1001 postpartum day # 1  - Continue routine PP care - Lactation consult prn.  - Discussed contraceptive options including implant, IUDs hormonal and non-hormonal, injection, pills/ring/patch, condoms, and NFP. Planning to use condoms - Acute blood loss anemia - hemodynamically stable and asymptomatic; start po ferrous sulfate BID with stool softeners  - Immunization status: all Imms up to date    Disposition: Does not desire Dc home today.     05/26/21, CNM 05/24/2021  8:55 AM

## 2021-05-24 NOTE — Anesthesia Postprocedure Evaluation (Signed)
Anesthesia Post Note  Patient: Carol Frye  Procedure(s) Performed: AN AD HOC LABOR EPIDURAL  Patient location during evaluation: Mother Baby Anesthesia Type: Epidural Level of consciousness: awake, oriented and awake and alert Pain management: pain level controlled Vital Signs Assessment: post-procedure vital signs reviewed and stable Respiratory status: spontaneous breathing, respiratory function stable and nonlabored ventilation Cardiovascular status: blood pressure returned to baseline and stable Postop Assessment: no headache and no backache Anesthetic complications: no   No notable events documented.   Last Vitals:  Vitals:   05/23/21 2350 05/24/21 0440  BP: 112/64 (!) 105/56  Pulse: 66 80  Resp: 17 17  Temp: 36.6 C 36.6 C  SpO2: 98% 99%    Last Pain:  Vitals:   05/24/21 0615  TempSrc:   PainSc: 1                  Chiropodist

## 2021-05-25 NOTE — Progress Notes (Signed)
Patient discharged home with infant. Discharge instructions, prescriptions and follow up appointment given to and reviewed with patient. Patient verbalized understanding  

## 2021-06-07 ENCOUNTER — Ambulatory Visit: Payer: Self-pay

## 2021-06-07 NOTE — Lactation Note (Signed)
This note was copied from a baby's chart. Lactation Consultation Note  Patient Name: Carol Frye Today's Date: 06/07/2021 Reason for consult: Follow-up assessment;Primapara;Early term 37-38.6wks;Other (Comment) (Difficult latch; Outpatient) Age:28 wk.o.  Outpatient lactation visit for P1 mother with 36 week old baby. Mom was given a nipple shield in hospital due to latching difficulties. Mom states baby was a slow feeder, never truly eager and support thought it may help.  Once discharged feedings have been via bottle, initially of formula, now transitioned into exclusive BM over the past week. Mom desires direct breast feeds as much as possible.   Mom has tried multiple times but states baby will suck 2-3 times and then get frustrated. Tips were given via phone last week from [facility] and mom has attempted these strategies.  Maternal Data Has patient been taught Hand Expression?: Yes Does the patient have breastfeeding experience prior to this delivery?: No  Pumping every 3 hours, at least 7-8x per 24 hours. Mom has adequate milk supply for baby's needs. Everted nipples and compressible tissue.  Feeding Mother's Current Feeding Choice: Breast Milk  Baby currently consuming 2.5-3oz every 3 hours via bottle.  LATCH Score Latch: Repeated attempts needed to sustain latch, nipple held in mouth throughout feeding, stimulation needed to elicit sucking reflex.  Audible Swallowing: Spontaneous and intermittent  Type of Nipple: Everted at rest and after stimulation  Comfort (Breast/Nipple): Soft / non-tender  Hold (Positioning): Assistance needed to correctly position infant at breast and maintain latch.  LATCH Score: 8   Lactation Tools Discussed/Used Tools: Nipple Shields Nipple shield size: 20  Interventions Interventions: Breast feeding basics reviewed;Assisted with latch;Hand express;Adjust position;Support pillows;DEBP;Education  Baby was brought to breast in  cradle hold with nipple shield and some formula (mom did not have EBM with her). Attempts for latching and sustained suck were on/off for 10 minutes. LC was able to hand express into the shield in which the baby would empty and then pull back. LC repositioned mom and brought baby closer to breast. Baby eventually did sustain the latch and was active at breast for 18 minutes with continuous suck, swallow pattern heard. Baby released from breast with relaxed body language, content.  Discharge Discharge Education: Outpatient recommendation Pump: Personal  Mom instructed to attempt feeds at earliest hunger cue (reviewed early cues), options for hand expressing or use of curved tip syringe into shield of EBM, pump post feeding attempt to protect supply.  LC explained importance of consistent exposure to the process, and how baby can re-learn to breastfed.  Reviewed paced bottle feeding for when bottle is given as another tool to transition from bottle back to the breast. Mom encouraged to call throughout the week with questions, steps, reassurance and support.  Lactation to follow-up next Thursday via phone.  Consult Status Consult Status: Complete    Danford Bad 06/07/2021, 2:51 PM

## 2021-07-12 ENCOUNTER — Other Ambulatory Visit: Payer: Self-pay

## 2021-07-12 ENCOUNTER — Emergency Department: Payer: BC Managed Care – PPO

## 2021-07-12 ENCOUNTER — Encounter: Payer: Self-pay | Admitting: Emergency Medicine

## 2021-07-12 ENCOUNTER — Emergency Department
Admission: EM | Admit: 2021-07-12 | Discharge: 2021-07-12 | Disposition: A | Discharge status: Home / Self Care | Payer: BC Managed Care – PPO | Attending: Emergency Medicine | Admitting: Emergency Medicine

## 2021-07-12 DIAGNOSIS — R1011 Right upper quadrant pain: Secondary | ICD-10-CM | POA: Diagnosis present

## 2021-07-12 DIAGNOSIS — R109 Unspecified abdominal pain: Secondary | ICD-10-CM

## 2021-07-12 LAB — COMPREHENSIVE METABOLIC PANEL
ALT: 57 U/L — ABNORMAL HIGH (ref 0–44)
AST: 40 U/L (ref 15–41)
Albumin: 4 g/dL (ref 3.5–5.0)
Alkaline Phosphatase: 89 U/L (ref 38–126)
Anion gap: 11 (ref 5–15)
BUN: 11 mg/dL (ref 6–20)
CO2: 25 mmol/L (ref 22–32)
Calcium: 9.4 mg/dL (ref 8.9–10.3)
Chloride: 101 mmol/L (ref 98–111)
Creatinine, Ser: 0.67 mg/dL (ref 0.44–1.00)
GFR, Estimated: >60 mL/min (ref >60)
Glucose, Bld: 107 mg/dL — ABNORMAL HIGH (ref 70–99)
Potassium: 4.2 mmol/L (ref 3.5–5.1)
Sodium: 137 mmol/L (ref 135–145)
Total Bilirubin: 1.1 mg/dL (ref 0.3–1.2)
Total Protein: 7.5 g/dL (ref 6.5–8.1)

## 2021-07-12 LAB — URINALYSIS, COMPLETE (UACMP) WITH MICROSCOPIC
Bilirubin Urine: NEGATIVE
Glucose, UA: NEGATIVE mg/dL
Hgb urine dipstick: NEGATIVE
Ketones, ur: NEGATIVE mg/dL
Nitrite: NEGATIVE
Protein, ur: NEGATIVE mg/dL
Specific Gravity, Urine: 1.004 — ABNORMAL LOW (ref 1.005–1.030)
pH: 7 (ref 5.0–8.0)

## 2021-07-12 LAB — CBC
HCT: 38.9 % (ref 36.0–46.0)
Hemoglobin: 13.5 g/dL (ref 12.0–15.0)
MCH: 29 pg (ref 26.0–34.0)
MCHC: 34.7 g/dL (ref 30.0–36.0)
MCV: 83.5 fL (ref 80.0–100.0)
Platelets: 268 10*3/uL (ref 150–400)
RBC: 4.66 MIL/uL (ref 3.87–5.11)
RDW: 12.8 % (ref 11.5–15.5)
WBC: 7.8 10*3/uL (ref 4.0–10.5)
nRBC: 0 % (ref 0.0–0.2)

## 2021-07-12 LAB — POC URINE PREG, ED: Preg Test, Ur: NEGATIVE

## 2021-07-12 LAB — LIPASE, BLOOD: Lipase: 36 U/L (ref 11–51)

## 2021-07-12 NOTE — ED Notes (Signed)
Poct Pregnancy Negative 

## 2021-07-12 NOTE — ED Notes (Signed)
Pt states that she had some diarrhea on Sunday and has been having right mid abd pain, pt states that it feels tender but denies vomiting, reports having a baby in July and resumed sexual activity last week and denies any discomfort with that, denies starting her cycle due to breast feeding.

## 2021-07-12 NOTE — ED Triage Notes (Signed)
Pt reports that she has RLQ pain since Sunday, she recently had a baby on 05/23/21. She has had diarrhea and some constipation has lost her appetite also. Moving makes it worse, nothing makes it better.

## 2021-07-12 NOTE — ED Triage Notes (Signed)
First nurse note:  C/O RLQ abdominal pain since Sunday.  AAOx3.  Skin warm and dry. NAD

## 2021-07-12 NOTE — ED Provider Notes (Signed)
[facility] Emergency Department Provider Note  Time seen: 1:38 PM  I have reviewed the triage vital signs and the nursing notes.   HISTORY  Chief Complaint Abdominal Pain   HPI Carol Frye is a 30 y.o. female with a past medical history of NSVD 1.5 months ago who presents to the emergency department for right-sided abdominal pain.  According to the patient since Sunday evening she has been experiencing right-sided abdominal pain that started approximately 1 hour after eating dinner.  Patient states the pain has been fairly constant in the right upper quadrant ever since.  Patient states some loose stool but denies any vomiting.  No dysuria hematuria vaginal bleeding or discharge.  No fever.   History reviewed. No pertinent past medical history.  Patient Active Problem List   Diagnosis Date Noted   Indication for care in labor or delivery 05/23/2021   Uterine contractions 05/02/2021   Amniotic fluid leaking 04/30/2021   Pregnancy 04/30/2021    Past Surgical History:  Procedure Laterality Date   NO PAST SURGERIES     OTHER SURGICAL HISTORY     patient was put to sleep for suture repair of face after dog attack at age of 7 years    Prior to Admission medications   Medication Sig Start Date End Date Taking? Authorizing Provider  Prenatal Vit-Fe Fumarate-FA (PRENATAL MULTIVITAMIN) TABS tablet Take 1 tablet by mouth daily at 12 noon.    [provider]    Allergies  Allergen Reactions   Sulfa Antibiotics     Throat swelling    Mushroom Extract Complex Rash    Family History  Problem Relation Age of Onset   Cancer Father     Social History Social History   Tobacco Use   Smoking status: Never   Smokeless tobacco: Never  Vaping Use   Vaping Use: Never used  Substance Use Topics   Alcohol use: Yes   Drug use: Never    Review of Systems Constitutional: Negative for fever. Cardiovascular: Negative for chest pain. Respiratory:  Negative for shortness of breath. Gastrointestinal: Right-sided abdominal pain.  Positive for diarrhea. Genitourinary: Negative for urinary compaints Musculoskeletal: Negative for musculoskeletal complaints Neurological: Negative for headache All other ROS negative  ____________________________________________   PHYSICAL EXAM:  VITAL SIGNS: ED Triage Vitals  Enc Vitals Group     BP 07/12/21 1129 135/71     Pulse Rate 07/12/21 1129 75     Resp 07/12/21 1129 18     Temp 07/12/21 1129 98 F (36.7 C)     Temp Source 07/12/21 1129 Oral     SpO2 07/12/21 1129 98 %     Weight 07/12/21 1129 210 lb (95.3 kg)     Height 07/12/21 1129 5\' 9"  (1.753 m)     Head Circumference --      Peak Flow --      Pain Score 07/12/21 1138 7     Pain Loc --      Pain Edu? --      Excl. in GC? --    Constitutional: Alert and oriented. Well appearing and in no distress. Eyes: Normal exam ENT      Head: Normocephalic and atraumatic.      Mouth/Throat: Mucous membranes are moist. Cardiovascular: Normal rate, regular rhythm.  Respiratory: Normal respiratory effort without tachypnea nor retractions. Breath sounds are clear Gastrointestinal: Soft, moderate right upper quadrant tenderness palpation with an otherwise benign abdomen including special attention paid to the right lower  quadrant.  No rebound guarding or distention. Musculoskeletal: Nontender with normal range of motion in all extremities.  Neurologic:  Normal speech and language. No gross focal neurologic deficits Skin:  Skin is warm, dry and intact.  Psychiatric: Mood and affect are normal.   ____________________________________________     RADIOLOGY  Ultrasound negative for acute abnormality. CT scan negative for acute abnormality.  ____________________________________________   INITIAL IMPRESSION / ASSESSMENT AND PLAN / ED COURSE  Pertinent labs & imaging results that were available during my care of the patient were reviewed by  me and considered in my medical decision making (see chart for details).   Patient presents emergency department for right upper quadrant abdominal pain.  Pain has been fairly constant x2 days starting approximate 1 hour after eating dinner Sunday night.  Patient has moderate tenderness to the right upper quadrant.  Slight ALT elevation otherwise largely normal LFTs.  Normal white blood cell count.  We will check urinalysis as a precaution.  We will obtain a right upper quadrant ultrasound to further evaluate.  Patient agreeable to plan of care.  Patient's work-up is quite reassuring.  Ultrasound negative for acute abnormality.  CT negative for acute abnormality.  Urinalysis largely within normal limits.  Patient very reassured by the work-up.  We will plan to discharge the patient home with PCP follow-up.  Patient agreeable to plan of care.  I did discuss return precautions as well as surgery follow-up if she continues to have right upper quadrant pain especially after eating.  [name] was evaluated in Emergency Department on 07/12/2021 for the symptoms described in the history of present illness. She was evaluated in the context of the global COVID-19 pandemic, which necessitated consideration that the patient might be at risk for infection with the SARS-CoV-2 virus that causes COVID-19. Institutional protocols and algorithms that pertain to the evaluation of patients at risk for COVID-19 are in a state of rapid change based on information released by regulatory bodies including the CDC and federal and state organizations. These policies and algorithms were followed during the patient's care in the ED.  ____________________________________________   FINAL CLINICAL IMPRESSION(S) / ED DIAGNOSES  Right upper quadrant abdominal pain   Minna Antis, MD 07/12/21 1605

## 2022-08-03 ENCOUNTER — Emergency Department: Payer: BC Managed Care – PPO

## 2022-08-03 ENCOUNTER — Emergency Department
Admission: EM | Admit: 2022-08-03 | Discharge: 2022-08-03 | Disposition: A | Discharge status: Home / Self Care | Payer: BC Managed Care – PPO | Attending: Emergency Medicine | Admitting: Emergency Medicine

## 2022-08-03 ENCOUNTER — Encounter: Payer: Self-pay | Admitting: Emergency Medicine

## 2022-08-03 ENCOUNTER — Other Ambulatory Visit: Payer: Self-pay

## 2022-08-03 DIAGNOSIS — D72829 Elevated white blood cell count, unspecified: Secondary | ICD-10-CM | POA: Insufficient documentation

## 2022-08-03 DIAGNOSIS — Z3A08 8 weeks gestation of pregnancy: Secondary | ICD-10-CM | POA: Diagnosis not present

## 2022-08-03 DIAGNOSIS — O219 Vomiting of pregnancy, unspecified: Secondary | ICD-10-CM | POA: Insufficient documentation

## 2022-08-03 DIAGNOSIS — R109 Unspecified abdominal pain: Secondary | ICD-10-CM

## 2022-08-03 DIAGNOSIS — R1011 Right upper quadrant pain: Secondary | ICD-10-CM | POA: Diagnosis not present

## 2022-08-03 LAB — URINALYSIS, ROUTINE W REFLEX MICROSCOPIC
Bilirubin Urine: NEGATIVE
Glucose, UA: NEGATIVE mg/dL
Hgb urine dipstick: NEGATIVE
Ketones, ur: 80 mg/dL — AB
Nitrite: NEGATIVE
Protein, ur: 30 mg/dL — AB
Specific Gravity, Urine: 1.018 (ref 1.005–1.030)
pH: 8 (ref 5.0–8.0)

## 2022-08-03 LAB — CBC
HCT: 37.2 % (ref 36.0–46.0)
Hemoglobin: 12.8 g/dL (ref 12.0–15.0)
MCH: 29 pg (ref 26.0–34.0)
MCHC: 34.4 g/dL (ref 30.0–36.0)
MCV: 84.2 fL (ref 80.0–100.0)
Platelets: 291 10*3/uL (ref 150–400)
RBC: 4.42 MIL/uL (ref 3.87–5.11)
RDW: 11.6 % (ref 11.5–15.5)
WBC: 10.9 10*3/uL — ABNORMAL HIGH (ref 4.0–10.5)
nRBC: 0 % (ref 0.0–0.2)

## 2022-08-03 LAB — COMPREHENSIVE METABOLIC PANEL WITH GFR
ALT: 14 U/L (ref 0–44)
AST: 19 U/L (ref 15–41)
Albumin: 4.2 g/dL (ref 3.5–5.0)
Alkaline Phosphatase: 76 U/L (ref 38–126)
Anion gap: 10 (ref 5–15)
BUN: 7 mg/dL (ref 6–20)
CO2: 20 mmol/L — ABNORMAL LOW (ref 22–32)
Calcium: 9.3 mg/dL (ref 8.9–10.3)
Chloride: 105 mmol/L (ref 98–111)
Creatinine, Ser: 0.61 mg/dL (ref 0.44–1.00)
GFR, Estimated: >60 mL/min
Glucose, Bld: 117 mg/dL — ABNORMAL HIGH (ref 70–99)
Potassium: 3.9 mmol/L (ref 3.5–5.1)
Sodium: 135 mmol/L (ref 135–145)
Total Bilirubin: 1 mg/dL (ref 0.3–1.2)
Total Protein: 7.9 g/dL (ref 6.5–8.1)

## 2022-08-03 LAB — LIPASE, BLOOD: Lipase: 29 U/L (ref 11–51)

## 2022-08-03 LAB — POC URINE PREG, ED: Preg Test, Ur: POSITIVE — AB

## 2022-08-03 MED ORDER — SODIUM CHLORIDE 0.9 % IV BOLUS
1000.0000 mL | Freq: Once | INTRAVENOUS | Status: AC
  Administered 2022-08-03: 1000 mL via INTRAVENOUS

## 2022-08-03 MED ORDER — CEPHALEXIN 500 MG PO CAPS: 500.0000 mg | ORAL_CAPSULE | Freq: Three times a day (TID) | ORAL | 0 refills | Status: AC

## 2022-08-03 MED ORDER — METOCLOPRAMIDE HCL 5 MG/ML IJ SOLN
10.0000 mg | Freq: Once | INTRAMUSCULAR | Status: AC
  Administered 2022-08-03: 10 mg via INTRAVENOUS
  Filled 2022-08-03: qty 2

## 2022-08-03 MED ORDER — ACETAMINOPHEN 500 MG PO TABS
1000.0000 mg | ORAL_TABLET | Freq: Once | ORAL | Status: AC
  Administered 2022-08-03: 1000 mg via ORAL
  Filled 2022-08-03: qty 2

## 2022-08-03 MED ORDER — PROMETHAZINE HCL 12.5 MG PO TABS: 12.5000 mg | ORAL_TABLET | ORAL | 0 refills | Status: DC | PRN

## 2022-08-03 NOTE — Discharge Instructions (Addendum)
-  Please take the full course of the antibiotics as prescribed.  -You may continue take the promethazine as needed for nausea/vomiting.  You may additionally continue taking acetaminophen at home for the abdominal pain.  Please review the educational material provided.  -Please follow-up with your OB/GYN provider, as discussed.  -Return to the emergency department at any time if you begin to experience any new or worsening symptoms.

## 2022-08-03 NOTE — ED Triage Notes (Signed)
Pt via POV from home. Pt c/o umbilical abd pain, mid back pain started at 8:00pm last night with vomiting. Pt is [redacted] weeks pregnant. States that her urine also has been dark. Denies vaginal bleeding. States she just started phenergan for there NV. Pt is A&Ox4 and NAD.

## 2022-08-03 NOTE — ED Provider Notes (Signed)
[facility] Provider Note    Event Date/Time   First MD Initiated Contact with Patient 08/03/22 0750     (approximate)   History   Chief Complaint Abdominal Pain   HPI Carol Frye is a 31 y.o. female, [redacted] weeks pregnant, presents emergency department for evaluation of abdominal/back pain that started at approximately 8 PM last night associated with multiple episodes of vomiting.  Urine has been dark.  She has taken promethazine for her nausea, though this has not helped much.  She describes her abdominal pain as sharp, localized around the right upper quadrant and periumbilical region radiating to her back.  Pain appears to come and go.  Denies fever/chills, dysuria, chest pain, shortness of breath, flank pain, hematemesis, hematochezia, diarrhea, headache, vision changes, hearing changes, rash/lesions, or dizziness/lightheadedness.  History Limitations: No limitations.        Physical Exam  Triage Vital Signs: ED Triage Vitals  Enc Vitals Group     BP 08/03/22 0727 131/76     Pulse Rate 08/03/22 0727 92     Resp 08/03/22 0727 18     Temp 08/03/22 0727 98 F (36.7 C)     Temp src --      SpO2 08/03/22 0727 94 %     Weight 08/03/22 0727 211 lb (95.7 kg)     Height 08/03/22 0727 5\' 9"  (1.753 m)     Head Circumference --      Peak Flow --      Pain Score 08/03/22 0733 7     Pain Loc --      Pain Edu? --      Excl. in [place]? --     Most recent vital signs: Vitals:   08/03/22 0727  BP: 131/76  Pulse: 92  Resp: 18  Temp: 98 F (36.7 C)  SpO2: 94%    General: Awake, appears uncomfortable. Skin: Warm, dry. No rashes or lesions.  Eyes: PERRL. Conjunctivae normal.  CV: Good peripheral perfusion.  Resp: Normal effort.  Abd: Soft, no distention.  Negative CVAT.  Notable tenderness with deep palpation in the right upper quadrant. Neuro: At baseline. No gross neurological deficits.  Musculoskeletal: Normal ROM of all extremities.  Focused Exam:  N/A.  Physical Exam    ED Results / Procedures / Treatments  Labs (all labs ordered are listed, but only abnormal results are displayed) Labs Reviewed  COMPREHENSIVE METABOLIC PANEL - Abnormal; Notable for the following components:      Result Value   CO2 20 (*)    Glucose, Bld 117 (*)    All other components within normal limits  CBC - Abnormal; Notable for the following components:   WBC 10.9 (*)    All other components within normal limits  URINALYSIS, ROUTINE W REFLEX MICROSCOPIC - Abnormal; Notable for the following components:   Color, Urine YELLOW (*)    APPearance HAZY (*)    Ketones, ur 80 (*)    Protein, ur 30 (*)    Leukocytes,Ua LARGE (*)    Bacteria, UA RARE (*)    All other components within normal limits  POC URINE PREG, ED - Abnormal; Notable for the following components:   Preg Test, Ur POSITIVE (*)    All other components within normal limits  LIPASE, BLOOD  HCG, QUANTITATIVE, PREGNANCY     EKG Sinus rhythm, rate of 63, right axis deviation present, no T-segment changes, no AV blocks, normal QRS, no QT prolongation.    RADIOLOGY  ED Provider Interpretation: I personally viewed and interpreted these images.  Right upper quadrant ultrasound negative.  Obstetric ultrasound negative.  US OB Comp Less 14 Wks  Result Date: 08/03/2022 CLINICAL DATA:  Abdominal pain. EXAM: OBSTETRIC <14 WK ULTRASOUND TECHNIQUE: Transabdominal ultrasound was performed for evaluation of the gestation as well as the maternal uterus and adnexal regions. COMPARISON:  None Available. FINDINGS: Intrauterine gestational sac: Single Yolk sac:  Visualized. Embryo:  Visualized. Cardiac Activity: Visualized. Heart Rate: 166 bpm CRL:   15.8 mm   8 w 0 d                  [place] EDC: Mar 15, 2023. Subchorionic hemorrhage:  None visualized. Maternal uterus/adnexae: Right ovary is unremarkable. Probable corpus luteum cyst seen in left ovary. No free fluid is noted. IMPRESSION: Single live  intrauterine gestation of 8 weeks 0 days. Electronically Signed   By: Lupita Raider M.D.   On: 08/03/2022 09:15   US Abdomen Limited RUQ (LIVER/GB)  Result Date: 08/03/2022 CLINICAL DATA:  Right quadrant pain EXAM: ULTRASOUND ABDOMEN LIMITED RIGHT UPPER QUADRANT COMPARISON:  None Available. FINDINGS: Gallbladder: No gallstones or wall thickening visualized. No sonographic Murphy sign noted by sonographer. Common bile duct: Diameter: 2 mm Liver: No focal lesion identified. Within normal limits in parenchymal echogenicity. Portal vein is patent on color Doppler imaging with normal direction of blood flow towards the liver. Other: None. IMPRESSION: Normal upper quadrant ultrasound. Electronically Signed   By: Allegra Lai M.D.   On: 08/03/2022 09:06    PROCEDURES:  Critical Care performed: N/A.  Procedures    MEDICATIONS ORDERED IN ED: Medications  sodium chloride 0.9 % bolus 1,000 mL (1,000 mLs Intravenous New Bag/Given 08/03/22 0818)  metoCLOPramide (REGLAN) injection 10 mg (10 mg Intravenous Given 08/03/22 0817)  acetaminophen (TYLENOL) tablet 1,000 mg (1,000 mg Oral Given 08/03/22 0817)     IMPRESSION / MDM / ASSESSMENT AND PLAN / ED COURSE  I reviewed the triage vital signs and the nursing notes.                              Differential diagnosis includes, but is not limited to, cholelithiasis, cholecystitis, ureterolithiasis, pyelonephritis, cystitis, hyperemesis gravidarum, gastritis, pancreatitis.  ED Course Patient appears clinically stable, but uncomfortable.  Vitals are within normal limits.  Given history, will treat with IV fluids, metoclopramide, and acetaminophen.  CBC shows mild leukocytosis at 10.9, no anemia.  CMP shows no transaminitis, AKI, or significant electrolyte derangements.  Lipase unremarkable at 29.  Assessment/Plan Patient presents with right upper quadrant/mid abdominal pain x1 day, associated with vomiting.  Peers well.  Vitals are within normal  limits.  Lab work-up shows mild leukocytosis at 10.5, otherwise very unremarkable.  No transaminitis.  She did have some objective right upper quadrant tenderness with deep palpation, however right upper quadrant ultrasound negative.  Transvaginal ultrasound negative as well.  Normal intrauterine pregnancy at 8 weeks.  I suspect that her symptoms are likely related to hyperemesis gravidarum versus possible transient gallstone.  She did state that her pain was mostly relieved by the acetaminophen.  Nausea has been mostly controlled with the metoclopramide.  Her urinalysis does show some large leukocytes and rare bacteria, suggestive of possible early infection.  Given her pregnancy, we will treat her as such.  No signs of hematuria to suggest ureterolithiasis.  We will provide her with a prescription for cephalexin.  Recommend that she  follow-up with her OB/GYN ongoing care.  Offered different antiemetics, however she states that the Phenergan works well for her.  I believe this is reasonable.  Will discharge.  Considered admission for this patient, but given her unremarkable work-up and access to close follow-up, she is unlikely benefit from admission.  Provided the patient with anticipatory guidance, return precautions, and educational material. Encouraged the patient to return to the emergency department at any time if they begin to experience any new or worsening symptoms. Patient expressed understanding and agreed with the plan.   Patient's presentation is most consistent with acute complicated illness / injury requiring diagnostic workup.       FINAL CLINICAL IMPRESSION(S) / ED DIAGNOSES   Final diagnoses:  Right upper quadrant abdominal pain  Nausea/vomiting in pregnancy     Rx / DC Orders   ED Discharge Orders          Ordered    promethazine (PHENERGAN) 12.5 MG tablet  Every 4 hours PRN        08/03/22 1013    cephALEXin (KEFLEX) 500 MG capsule  3 times daily        08/03/22 1025              Note:  This document was prepared using Dragon voice recognition software and may include unintentional dictation errors.   Varney Daily, [place] 08/03/22 1026    Chesley Noon, MD 08/03/22 [phone]

## 2022-08-11 ENCOUNTER — Emergency Department: Payer: BC Managed Care – PPO | Admitting: Anesthesiology

## 2022-08-11 ENCOUNTER — Other Ambulatory Visit: Payer: Self-pay

## 2022-08-11 ENCOUNTER — Emergency Department: Payer: BC Managed Care – PPO

## 2022-08-11 ENCOUNTER — Encounter: Payer: Self-pay | Admitting: Emergency Medicine

## 2022-08-11 ENCOUNTER — Inpatient Hospital Stay
Admission: EM | Admit: 2022-08-11 | Discharge: 2022-08-13 | DRG: 819 | Disposition: A | Discharge status: Home / Self Care | Payer: BC Managed Care – PPO | Attending: Obstetrics and Gynecology | Admitting: Obstetrics and Gynecology

## 2022-08-11 ENCOUNTER — Inpatient Hospital Stay: Payer: BC Managed Care – PPO

## 2022-08-11 ENCOUNTER — Encounter
Admission: EM | Disposition: A | Discharge status: Home / Self Care | Payer: Self-pay | Source: Home / Self Care | Attending: Obstetrics and Gynecology

## 2022-08-11 DIAGNOSIS — O99891 Other specified diseases and conditions complicating pregnancy: Principal | ICD-10-CM | POA: Diagnosis present

## 2022-08-11 DIAGNOSIS — K6389 Other specified diseases of intestine: Secondary | ICD-10-CM | POA: Diagnosis present

## 2022-08-11 DIAGNOSIS — N838 Other noninflammatory disorders of ovary, fallopian tube and broad ligament: Secondary | ICD-10-CM

## 2022-08-11 DIAGNOSIS — R1031 Right lower quadrant pain: Secondary | ICD-10-CM | POA: Diagnosis present

## 2022-08-11 DIAGNOSIS — O211 Hyperemesis gravidarum with metabolic disturbance: Secondary | ICD-10-CM | POA: Diagnosis present

## 2022-08-11 DIAGNOSIS — Z3A09 9 weeks gestation of pregnancy: Secondary | ICD-10-CM | POA: Diagnosis not present

## 2022-08-11 DIAGNOSIS — R1903 Right lower quadrant abdominal swelling, mass and lump: Secondary | ICD-10-CM | POA: Diagnosis present

## 2022-08-11 DIAGNOSIS — O210 Mild hyperemesis gravidarum: Principal | ICD-10-CM

## 2022-08-11 DIAGNOSIS — G8929 Other chronic pain: Secondary | ICD-10-CM | POA: Diagnosis present

## 2022-08-11 DIAGNOSIS — N3000 Acute cystitis without hematuria: Secondary | ICD-10-CM

## 2022-08-11 HISTORY — PX: ROBOTIC ASSISTED LAPAROSCOPIC OVARIAN CYSTECTOMY: SHX6081

## 2022-08-11 LAB — COMPREHENSIVE METABOLIC PANEL
ALT: 26 U/L (ref 0–44)
AST: 19 U/L (ref 15–41)
Albumin: 3.3 g/dL — ABNORMAL LOW (ref 3.5–5.0)
Alkaline Phosphatase: 98 U/L (ref 38–126)
Anion gap: 15 (ref 5–15)
BUN: <5 mg/dL — ABNORMAL LOW (ref 6–20)
CO2: 19 mmol/L — ABNORMAL LOW (ref 22–32)
Calcium: 9.5 mg/dL (ref 8.9–10.3)
Chloride: 100 mmol/L (ref 98–111)
Creatinine, Ser: 0.55 mg/dL (ref 0.44–1.00)
GFR, Estimated: >60 mL/min (ref >60)
Glucose, Bld: 122 mg/dL — ABNORMAL HIGH (ref 70–99)
Potassium: 2.9 mmol/L — ABNORMAL LOW (ref 3.5–5.1)
Sodium: 134 mmol/L — ABNORMAL LOW (ref 135–145)
Total Bilirubin: 1.2 mg/dL (ref 0.3–1.2)
Total Protein: 7.9 g/dL (ref 6.5–8.1)

## 2022-08-11 LAB — URINALYSIS, ROUTINE W REFLEX MICROSCOPIC
Bilirubin Urine: NEGATIVE
Glucose, UA: NEGATIVE mg/dL
Hgb urine dipstick: NEGATIVE
Ketones, ur: 80 mg/dL — AB
Nitrite: NEGATIVE
Protein, ur: 100 mg/dL — AB
Specific Gravity, Urine: 1.026 (ref 1.005–1.030)
pH: 6 (ref 5.0–8.0)

## 2022-08-11 LAB — LIPASE, BLOOD: Lipase: 24 U/L (ref 11–51)

## 2022-08-11 LAB — CBC
HCT: 37.3 % (ref 36.0–46.0)
Hemoglobin: 13 g/dL (ref 12.0–15.0)
MCH: 28.1 pg (ref 26.0–34.0)
MCHC: 34.9 g/dL (ref 30.0–36.0)
MCV: 80.7 fL (ref 80.0–100.0)
Platelets: 338 10*3/uL (ref 150–400)
RBC: 4.62 MIL/uL (ref 3.87–5.11)
RDW: 11.6 % (ref 11.5–15.5)
WBC: 16.5 10*3/uL — ABNORMAL HIGH (ref 4.0–10.5)
nRBC: 0 % (ref 0.0–0.2)

## 2022-08-11 LAB — HCG, QUANTITATIVE, PREGNANCY: hCG, Beta Chain, Quant, S: 91012 m[IU]/mL — ABNORMAL HIGH (ref <5)

## 2022-08-11 LAB — POTASSIUM: Potassium: 3 mmol/L — ABNORMAL LOW (ref 3.5–5.1)

## 2022-08-11 LAB — ABO/RH: ABO/RH(D): A POS

## 2022-08-11 LAB — MAGNESIUM: Magnesium: 1.8 mg/dL (ref 1.7–2.4)

## 2022-08-11 SURGERY — EXCISION, CYST, OVARY, ROBOT-ASSISTED, LAPAROSCOPIC
Anesthesia: General | Site: Abdomen

## 2022-08-11 MED ORDER — DOCUSATE SODIUM 100 MG PO CAPS
100.0000 mg | ORAL_CAPSULE | Freq: Two times a day (BID) | ORAL | Status: DC
  Administered 2022-08-12 – 2022-08-13 (×2): 100 mg via ORAL
  Filled 2022-08-11 (×2): qty 1

## 2022-08-11 MED ORDER — METOCLOPRAMIDE HCL 5 MG/ML IJ SOLN
10.0000 mg | Freq: Once | INTRAMUSCULAR | Status: AC
  Administered 2022-08-11: 10 mg via INTRAVENOUS
  Filled 2022-08-11: qty 2

## 2022-08-11 MED ORDER — FENTANYL CITRATE (PF) 100 MCG/2ML IJ SOLN
INTRAMUSCULAR | Status: AC
  Filled 2022-08-11: qty 2

## 2022-08-11 MED ORDER — PROPOFOL 500 MG/50ML IV EMUL
INTRAVENOUS | Status: DC | PRN
  Administered 2022-08-11: 150 ug/kg/min via INTRAVENOUS

## 2022-08-11 MED ORDER — ONDANSETRON HCL 4 MG PO TABS: 4.0000 mg | ORAL_TABLET | Freq: Four times a day (QID) | ORAL | Status: DC | PRN

## 2022-08-11 MED ORDER — SODIUM CHLORIDE 0.9 % IV BOLUS
1000.0000 mL | Freq: Once | INTRAVENOUS | Status: AC
  Administered 2022-08-11: 1000 mL via INTRAVENOUS

## 2022-08-11 MED ORDER — DEXAMETHASONE SODIUM PHOSPHATE 10 MG/ML IJ SOLN
INTRAMUSCULAR | Status: DC | PRN
  Administered 2022-08-11: 10 mg via INTRAVENOUS

## 2022-08-11 MED ORDER — SIMETHICONE 80 MG PO CHEW: 80.0000 mg | CHEWABLE_TABLET | Freq: Four times a day (QID) | ORAL | Status: DC | PRN

## 2022-08-11 MED ORDER — MIDAZOLAM HCL 2 MG/2ML IJ SOLN
INTRAMUSCULAR | Status: AC
  Filled 2022-08-11: qty 2

## 2022-08-11 MED ORDER — 0.9 % SODIUM CHLORIDE (POUR BTL) OPTIME
TOPICAL | Status: DC | PRN
  Administered 2022-08-11: 500 mL

## 2022-08-11 MED ORDER — ONDANSETRON HCL 4 MG/2ML IJ SOLN
4.0000 mg | Freq: Four times a day (QID) | INTRAMUSCULAR | Status: DC | PRN
  Administered 2022-08-11 – 2022-08-12 (×2): 4 mg via INTRAVENOUS
  Filled 2022-08-11 (×2): qty 2

## 2022-08-11 MED ORDER — PROPOFOL 10 MG/ML IV BOLUS
INTRAVENOUS | Status: AC
  Filled 2022-08-11: qty 20

## 2022-08-11 MED ORDER — DOXYLAMINE SUCCINATE (SLEEP) 25 MG PO TABS
25.0000 mg | ORAL_TABLET | Freq: Two times a day (BID) | ORAL | Status: DC
  Administered 2022-08-12 – 2022-08-13 (×2): 25 mg via ORAL
  Filled 2022-08-11 (×3): qty 1

## 2022-08-11 MED ORDER — PROPOFOL 10 MG/ML IV BOLUS
INTRAVENOUS | Status: DC | PRN
  Administered 2022-08-11: 100 mg via INTRAVENOUS
  Administered 2022-08-11: 200 mg via INTRAVENOUS

## 2022-08-11 MED ORDER — DEXMEDETOMIDINE HCL IN NACL 80 MCG/20ML IV SOLN
INTRAVENOUS | Status: DC | PRN
  Administered 2022-08-11: 8 ug via BUCCAL
  Administered 2022-08-11: 12 ug via BUCCAL

## 2022-08-11 MED ORDER — KCL IN DEXTROSE-NACL 40-5-0.9 MEQ/L-%-% IV SOLN
INTRAVENOUS | Status: DC
  Filled 2022-08-11 (×9): qty 1000

## 2022-08-11 MED ORDER — THIAMINE HCL 100 MG/ML IJ SOLN
Freq: Once | INTRAVENOUS | Status: AC
  Filled 2022-08-11: qty 1000

## 2022-08-11 MED ORDER — PROPOFOL 1000 MG/100ML IV EMUL
INTRAVENOUS | Status: AC
  Filled 2022-08-11: qty 100

## 2022-08-11 MED ORDER — LACTATED RINGERS IV SOLN: INTRAVENOUS | Status: DC

## 2022-08-11 MED ORDER — FENTANYL CITRATE (PF) 100 MCG/2ML IJ SOLN
INTRAMUSCULAR | Status: DC | PRN
  Administered 2022-08-11 (×4): 50 ug via INTRAVENOUS

## 2022-08-11 MED ORDER — FAMOTIDINE IN NACL 20-0.9 MG/50ML-% IV SOLN
20.0000 mg | Freq: Two times a day (BID) | INTRAVENOUS | Status: DC
  Administered 2022-08-12: 20 mg via INTRAVENOUS
  Filled 2022-08-11 (×4): qty 50

## 2022-08-11 MED ORDER — BUPIVACAINE HCL 0.5 % IJ SOLN
INTRAMUSCULAR | Status: DC | PRN
  Administered 2022-08-11: 11 mL

## 2022-08-11 MED ORDER — BUPIVACAINE HCL (PF) 0.5 % IJ SOLN
INTRAMUSCULAR | Status: AC
  Filled 2022-08-11: qty 30

## 2022-08-11 MED ORDER — SUCCINYLCHOLINE CHLORIDE 200 MG/10ML IV SOSY
PREFILLED_SYRINGE | INTRAVENOUS | Status: DC | PRN
  Administered 2022-08-11: 120 mg via INTRAVENOUS

## 2022-08-11 MED ORDER — ONDANSETRON HCL 4 MG/2ML IJ SOLN: 4.0000 mg | Freq: Once | INTRAMUSCULAR | Status: DC | PRN

## 2022-08-11 MED ORDER — SODIUM CHLORIDE 0.9 % IV SOLN
2.0000 g | Freq: Once | INTRAVENOUS | Status: AC
  Administered 2022-08-11: 2 g via INTRAVENOUS
  Filled 2022-08-11: qty 20

## 2022-08-11 MED ORDER — HYDROMORPHONE HCL 1 MG/ML IJ SOLN: 0.2500 mg | INTRAMUSCULAR | Status: DC | PRN

## 2022-08-11 MED ORDER — HYDROCODONE-ACETAMINOPHEN 5-325 MG PO TABS
1.0000 | ORAL_TABLET | ORAL | Status: DC | PRN
  Administered 2022-08-11 – 2022-08-13 (×5): 1 via ORAL
  Filled 2022-08-11 (×5): qty 1

## 2022-08-11 MED ORDER — ONDANSETRON 4 MG PO TBDP
4.0000 mg | ORAL_TABLET | Freq: Once | ORAL | Status: DC
  Filled 2022-08-11: qty 1

## 2022-08-11 MED ORDER — POTASSIUM CHLORIDE 10 MEQ/100ML IV SOLN
10.0000 meq | INTRAVENOUS | Status: AC
  Administered 2022-08-11 (×2): 10 meq via INTRAVENOUS
  Filled 2022-08-11: qty 100

## 2022-08-11 MED ORDER — FENTANYL CITRATE (PF) 100 MCG/2ML IJ SOLN: 25.0000 ug | INTRAMUSCULAR | Status: DC | PRN

## 2022-08-11 MED ORDER — GLYCOPYRROLATE 0.2 MG/ML IJ SOLN
INTRAMUSCULAR | Status: DC | PRN
  Administered 2022-08-11: .6 mg via INTRAVENOUS

## 2022-08-11 MED ORDER — NEOSTIGMINE METHYLSULFATE 10 MG/10ML IV SOLN
INTRAVENOUS | Status: DC | PRN
  Administered 2022-08-11: 5 mg via INTRAVENOUS

## 2022-08-11 MED ORDER — LIDOCAINE HCL (CARDIAC) PF 100 MG/5ML IV SOSY
PREFILLED_SYRINGE | INTRAVENOUS | Status: DC | PRN
  Administered 2022-08-11: 60 mg via INTRAVENOUS
  Administered 2022-08-11: 40 mg via INTRAVENOUS

## 2022-08-11 MED ORDER — MENTHOL 3 MG MT LOZG
1.0000 | LOZENGE | OROMUCOSAL | Status: DC | PRN
  Administered 2022-08-12: 3 mg via ORAL
  Filled 2022-08-11: qty 9

## 2022-08-11 MED ORDER — PYRIDOXINE HCL 25 MG PO TABS
25.0000 mg | ORAL_TABLET | Freq: Two times a day (BID) | ORAL | Status: DC
  Administered 2022-08-12 – 2022-08-13 (×2): 25 mg via ORAL
  Filled 2022-08-11 (×4): qty 1

## 2022-08-11 MED ORDER — ROCURONIUM BROMIDE 100 MG/10ML IV SOLN
INTRAVENOUS | Status: DC | PRN
  Administered 2022-08-11: 60 mg via INTRAVENOUS
  Administered 2022-08-11: 40 mg via INTRAVENOUS

## 2022-08-11 MED ORDER — FAMOTIDINE 20 MG PO TABS
20.0000 mg | ORAL_TABLET | Freq: Two times a day (BID) | ORAL | Status: DC
  Administered 2022-08-13 (×2): 20 mg via ORAL
  Filled 2022-08-11 (×3): qty 1

## 2022-08-11 MED ORDER — DEXTROSE 5 % AND 0.9 % NACL IV BOLUS
1000.0000 mL | Freq: Once | INTRAVENOUS | Status: AC
  Administered 2022-08-11: 1000 mL via INTRAVENOUS

## 2022-08-11 MED ORDER — ONDANSETRON HCL 4 MG/2ML IJ SOLN
INTRAMUSCULAR | Status: DC | PRN
  Administered 2022-08-11 (×2): 4 mg via INTRAVENOUS

## 2022-08-11 SURGICAL SUPPLY — 65 items
BAG URINE DRAIN 2000ML AR STRL (UROLOGICAL SUPPLIES) ×1 IMPLANT
BASIN KIT SINGLE STR (MISCELLANEOUS) ×1 IMPLANT
BLADE SURG SZ11 CARB STEEL (BLADE) ×1 IMPLANT
CATH FOLEY 2WAY  5CC 16FR (CATHETERS) ×1
CATH URTH 16FR FL 2W BLN LF (CATHETERS) ×1 IMPLANT
CHLORAPREP W/TINT 26 (MISCELLANEOUS) ×1 IMPLANT
COVER TIP SHEARS 8 DVNC (MISCELLANEOUS) ×1 IMPLANT
COVER TIP SHEARS 8MM DA VINCI (MISCELLANEOUS) ×1
DEFOGGER SCOPE WARMER CLEARIFY (MISCELLANEOUS) ×1 IMPLANT
DERMABOND ADVANCED .7 DNX12 (GAUZE/BANDAGES/DRESSINGS) ×1 IMPLANT
DRAPE 3/4 80X56 (DRAPES) IMPLANT
DRAPE ARM DVNC X/XI (DISPOSABLE) ×3 IMPLANT
DRAPE DA VINCI XI ARM (DISPOSABLE) ×3
DRAPE LEGGINS SURG 28X43 STRL (DRAPES) ×1 IMPLANT
DRAPE UNDER BUTTOCK W/FLU (DRAPES) ×1 IMPLANT
ELECT REM PT RETURN 9FT ADLT (ELECTROSURGICAL) ×1
ELECTRODE REM PT RTRN 9FT ADLT (ELECTROSURGICAL) ×1 IMPLANT
GLOVE BIO SURGEON STRL SZ7 (GLOVE) ×3 IMPLANT
GLOVE SURG UNDER POLY LF SZ7.5 (GLOVE) ×3 IMPLANT
GOWN STRL REUS W/ TWL LRG LVL3 (GOWN DISPOSABLE) ×6 IMPLANT
GOWN STRL REUS W/TWL LRG LVL3 (GOWN DISPOSABLE) ×6
GRASPER SUT TROCAR 14GX15 (MISCELLANEOUS) IMPLANT
IRRIGATION STRYKERFLOW (MISCELLANEOUS) IMPLANT
IRRIGATOR STRYKERFLOW (MISCELLANEOUS)
IV NS 1000ML (IV SOLUTION)
IV NS 1000ML BAXH (IV SOLUTION) IMPLANT
KIT PINK PAD W/HEAD ARE REST (MISCELLANEOUS) ×1 IMPLANT
KIT PINK PAD W/HEAD ARM REST (MISCELLANEOUS) ×1 IMPLANT
KIT TURNOVER CYSTO (KITS) ×1 IMPLANT
LABEL OR SOLS (LABEL) ×1 IMPLANT
MANIFOLD NEPTUNE II (INSTRUMENTS) ×1 IMPLANT
MANIPULATOR VCARE LG CRV RETR (MISCELLANEOUS) IMPLANT
MANIPULATOR VCARE SML CRV RETR (MISCELLANEOUS) IMPLANT
MANIPULATOR VCARE STD CRV RETR (MISCELLANEOUS) IMPLANT
NEEDLE HYPO 22GX1.5 SAFETY (NEEDLE) ×1 IMPLANT
NS IRRIG 1000ML POUR BTL (IV SOLUTION) ×2 IMPLANT
OBTURATOR OPTICAL STANDARD 8MM (TROCAR) ×1
OBTURATOR OPTICAL STND 8 DVNC (TROCAR) ×1
OBTURATOR OPTICALSTD 8 DVNC (TROCAR) ×1 IMPLANT
OCCLUDER COLPOPNEUMO (BALLOONS) ×1 IMPLANT
PACK LAP CHOLECYSTECTOMY (MISCELLANEOUS) ×1 IMPLANT
PAD ARMBOARD 7.5X6 YLW CONV (MISCELLANEOUS) ×1 IMPLANT
PAD OB MATERNITY 4.3X12.25 (PERSONAL CARE ITEMS) ×1 IMPLANT
PAD PREP 24X41 OB/GYN DISP (PERSONAL CARE ITEMS) ×1 IMPLANT
SCRUB CHG 4% DYNA-HEX 4OZ (MISCELLANEOUS) ×1 IMPLANT
SEAL CANN UNIV 5-8 DVNC XI (MISCELLANEOUS) ×3 IMPLANT
SEAL XI 5MM-8MM UNIVERSAL (MISCELLANEOUS) ×3
SOLUTION ELECTROLUBE (MISCELLANEOUS) ×1 IMPLANT
SPONGE T-LAP 18X18 ~~LOC~~+RFID (SPONGE) ×1 IMPLANT
SURGILUBE 2OZ TUBE FLIPTOP (MISCELLANEOUS) ×1 IMPLANT
SUT DVC VLOC 180 0 12IN GS21 (SUTURE) ×2
SUT VIC AB 0 CT2 27 (SUTURE) ×1 IMPLANT
SUT VIC AB 1 CT1 36 (SUTURE) ×2 IMPLANT
SUT VIC AB 2-0 CT1 27 (SUTURE) ×1
SUT VIC AB 2-0 CT1 TAPERPNT 27 (SUTURE) ×1 IMPLANT
SUT VIC AB 4-0 SH 27 (SUTURE) ×2
SUT VIC AB 4-0 SH 27XANBCTRL (SUTURE) ×2 IMPLANT
SUT VICRYL 0 AB UR-6 (SUTURE) ×1 IMPLANT
SUTURE DVC VLC 180 0 12IN GS21 (SUTURE) ×2 IMPLANT
SYR 10ML LL (SYRINGE) ×1 IMPLANT
SYR 50ML LL SCALE MARK (SYRINGE) ×1 IMPLANT
TRAP FLUID SMOKE EVACUATOR (MISCELLANEOUS) ×1 IMPLANT
TUBING EVAC SMOKE HEATED PNEUM (TUBING) ×1 IMPLANT
WAND RF SURG SPNG DETECT SYS (INSTRUMENTS) ×1 IMPLANT
WATER STERILE IRR 500ML POUR (IV SOLUTION) ×1 IMPLANT

## 2022-08-11 NOTE — ED Triage Notes (Signed)
C/O vomiting, ongoing - has been taking zofran, unable to keep anything down for 12-18 hours.  Also c/o abdominal pain and low back pain.  Recent UTI, but unable to tolerate antibiotics.

## 2022-08-11 NOTE — Op Note (Addendum)
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 Lysle Pearl) with adhesions to right fallopian tube and ovary, omentum  Procedure Summary:  The patient was taken to the operating room where general anesthesia was administered and found to be adequate. She was placed in the dorsal supine lithotomy position in [place] stirrups and prepped and draped in usual sterile fashion. After a timeout was called an indwelling catheter was placed in her bladder.    Attention was turned to the abdomen where after injection of local anesthetic, an 8 mm infraumbilical incision was made with the scalpel. Entry into the abdomen was obtained via Optiview trocar technique (a blunt entry technique with camera visualization through the obturator upon entry). Verification of entry into the abdomen was obtained using opening pressures. The abdomen was insufflated with CO2. The camera was introduced through the trocar with verification of atraumatic entry.  Right and left abdominal entry sites were created after injection of local anesthetic about 8 cm away from the umbilical port in accordance with the Intuitive manufacturer's recommendations.  The port sites were 8 mm.  The intuitive trochars were introduced under intra-abdominal camera  visualization without difficulty.  A survey of the pelvis was undertaken with the above-noted findings.  Initially assessment was taken using regular laparoscopic techniques.  I was unable to differentiate the enlarged tissue from the right adnexal tissue.  It did not appear to me that this was related to her ovary or her fallopian tube, both of which were visible and appeared separate from the lesion.  I did try to determine whether this lesion was attached to the uterus or if it was a part of the cecum or distal ileum.  Since I was unable to make a determination, I decided to call an intraoperative consult from general surgery.  Dr. Lysle Pearl entered and performed a general inspection.  Eventually the robot was docked to gain better visualization and maneuverability.  The XI robot was docked on the patient's left.  Clearance was verified from the patient's legs.  Through the umbilical port (port 3) the camera was placed.  Through the port attached to arm 4 the monopolar scissors were placed.  Through the port attached to arm 2 forceps were was placed.    See Dr. Randie Heinz note for further details of the assessment portion of the procedure for him.  Once you concluded his assessment, decision was made to terminate the surgery at this point without further intervention.  All instruments and the camera were removed and the robot was undocked and moved away from the bedside.  The abdomen was emptied of CO2 with the aid of 5 deep breaths from anesthesia..  All trocars were removed.  The incision sites were closed using 4-0 Monocryl in a subcuticular fashion and reinforced using surgical skin glue.  The Foley  catheter was removed from the patient's bladder.  No sponges or instruments were placed inside the patient's vagina as part of the surgery.  Of note, I performed a diagnostic laparoscopy without docking the robot. The robot was later docked by Dr. Tonna Boehringer (who was called in as a surgical consult.) So, I  performed all the initial portions of the surgery, a diagnostic laparoscopy, obtained a surgical consult, docked and undocked the robot, removed all trocars, and closed the incision sites.  The patient tolerated the procedure well.  Sponge, lap, needle, and instrument counts were correct x 2.  VTE prophylaxis: SCDs. Antibiotic prophylaxis: none. She was awakened in the operating room and was taken to the PACU in stable condition.   Carol Mohair, MD 08/11/2022 6:21 PM

## 2022-08-11 NOTE — ED Provider Triage Note (Signed)
Emergency Medicine Provider Triage Evaluation Note  Carol Frye , a 31 y.o. female  was evaluated in triage.  Pt complains of abdominal cramping, v/, 1 episode diarrhea, had uti but cannot keep the antibiotic down due to v/. .9 weeks preg  Review of Systems  Positive:  Negative:   Physical Exam  Ht 5\' 9"  (1.753 m)   Wt 95.7 kg   BMI 31.16 kg/m  Gen:   Awake, no distress   Resp:  Normal effort  MSK:   Moves extremities without difficulty  Other:    Medical Decision Making  Medically screening exam initiated at 7:41 AM.  Appropriate orders placed.  Carol Frye was informed that the remainder of the evaluation will be completed by another provider, this initial triage assessment does not replace that evaluation, and the importance of remaining in the ED until their evaluation is complete.     Carol Starks, PA-C 08/11/22 [phone]
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
## 2022-08-11 NOTE — Transfer of Care (Signed)
Immediate Anesthesia Transfer of Care Note  Patient: Carol Frye  Procedure(s) Performed: XI ROBOTIC ASSISTED DIAGNOSTIC LAPAROSCOPY (Abdomen)  Patient Location: PACU  Anesthesia Type:General  Level of Consciousness: drowsy  Airway & Oxygen Therapy: Patient Spontanous Breathing, Patient connected to nasal cannula oxygen and Patient connected to face mask oxygen  Post-op Assessment: Report given to RN and Post -op Vital signs reviewed and stable  Post vital signs: Reviewed and stable  Last Vitals:  Vitals Value Taken Time  BP 122/60 08/11/22 1841  Temp 36.4 C 08/11/22 1833  Pulse 72 08/11/22 1843  Resp 14 08/11/22 1843  SpO2 100 % 08/11/22 1843  Vitals shown include unvalidated device data.  Last Pain:  Vitals:   08/11/22 1833  TempSrc:   PainSc: Asleep      Patients Stated Pain Goal: 2 (09/64/38 3818)  Complications: No notable events documented.

## 2022-08-11 NOTE — ED Provider Notes (Signed)
[facility] Provider Note    Event Date/Time   First MD Initiated Contact with Patient 08/11/22 [phone]     (approximate)   History   Emesis During Pregnancy   HPI  Carol Frye is a 31 y.o. female who comes in with concerns for vomiting over the past 12 to 18 hours with abdominal and low back pain.  Patient reports recent UTI but unable to tolerate medications.  Patient reports pain is in the right lower quadrant.  She denies having this previously.  She reports this is her third pregnancy.  She had 1 pregnancy with a live birth and had a miscarriage.  She denies any trauma to her abdomen.  She denies any vaginal bleeding.     Physical Exam   Triage Vital Signs: ED Triage Vitals  Enc Vitals Group     BP 08/11/22 0734 (!) 127/91     Pulse Rate 08/11/22 0734 100     Resp 08/11/22 0734 20     Temp 08/11/22 0734 97.6 F (36.4 C)     Temp src --      SpO2 08/11/22 0734 100 %     Weight 08/11/22 0730 210 lb 15.7 oz (95.7 kg)     Height 08/11/22 0730 5\' 9"  (1.753 m)     Head Circumference --      Peak Flow --      Pain Score 08/11/22 0729 9     Pain Loc --      Pain Edu? --      Excl. in [place]? --     Most recent vital signs: Vitals:   08/11/22 0734  BP: (!) 127/91  Pulse: 100  Resp: 20  Temp: 97.6 F (36.4 C)  SpO2: 100%     General: Awake, no distress.  CV:  Good peripheral perfusion.  Resp:  Normal effort.  Abd:  No distention.  Tender in the right lower quadrant Other:     ED Results / Procedures / Treatments   Labs (all labs ordered are listed, but only abnormal results are displayed) Labs Reviewed  COMPREHENSIVE METABOLIC PANEL - Abnormal; Notable for the following components:      Result Value   Sodium 134 (*)    Potassium 2.9 (*)    CO2 19 (*)    Glucose, Bld 122 (*)    BUN <5 (*)    Albumin 3.3 (*)    All other components within normal limits  CBC - Abnormal; Notable for the following components:   WBC 16.5 (*)    All  other components within normal limits  URINALYSIS, ROUTINE W REFLEX MICROSCOPIC - Abnormal; Notable for the following components:   Color, Urine YELLOW (*)    APPearance HAZY (*)    Ketones, ur 80 (*)    Protein, ur 100 (*)    Leukocytes,Ua MODERATE (*)    Bacteria, UA MANY (*)    All other components within normal limits  HCG, QUANTITATIVE, PREGNANCY - Abnormal; Notable for the following components:   hCG, Beta Chain, Quant, S 91,012 (*)    All other components within normal limits  LIPASE, BLOOD  ABO/RH    RADIOLOGY I have reviewed the ultrasound personally and interpreted patient does have fluid collection on the right ovary with intrauterine gestation  PROCEDURES:  Critical Care performed: No  Procedures   MEDICATIONS ORDERED IN ED: Medications  lactated ringers infusion ( Intravenous Continued from Pre-op 08/11/22 1522)  metoCLOPramide (REGLAN) injection 10  mg (10 mg Intravenous Given 08/11/22 0742)  sodium chloride 0.9 % bolus 1,000 mL (0 mLs Intravenous Stopped 08/11/22 1008)  potassium chloride 10 mEq in 100 mL IVPB (0 mEq Intravenous Stopped 08/11/22 1230)  dextrose 5 % and 0.9% NaCl 5-0.9 % bolus 1,000 mL (1,000 mLs Intravenous New Bag/Given 08/11/22 1019)  cefTRIAXone (ROCEPHIN) 2 g in sodium chloride 0.9 % 100 mL IVPB (0 g Intravenous Stopped 08/11/22 1044)     IMPRESSION / MDM / ASSESSMENT AND PLAN / ED COURSE  I reviewed the triage vital signs and the nursing notes.   Patient's presentation is most consistent with acute presentation with potential threat to life or bodily function.   Differential includes hyperemesis gravidarum, ovarian cyst, ovarian torsion.  Urine with evidence of ketones in it with many bacteria.  Will send for culture.  Lipase normal.  CBC shows elevated white count most likely reactive from the vomiting.  Her CMP shows glucose of 122, potassium of 2.9 we will give some IV repletion add on magnesium.  Ultrasounds concerning given that  on the fifth she had normal ultrasound of the right ovary but now she has a complex fluid collection on the right ovary.  Will discuss with [facility] clinic OB/GYN.  They will come see patient.   Patient to go to the OR with OB/GYN.  The patient is on the cardiac monitor to evaluate for evidence of arrhythmia and/or significant heart rate changes.      FINAL CLINICAL IMPRESSION(S) / ED DIAGNOSES   Final diagnoses:  Hyperemesis arising during pregnancy  Acute cystitis without hematuria  Ovarian mass, right     Rx / DC Orders   ED Discharge Orders     None        Note:  This document was prepared using Dragon voice recognition software and may include unintentional dictation errors.   Vanessa Elberon, MD 08/11/22 [phone]

## 2022-08-11 NOTE — H&P (Signed)
GYNECOLOGY CONSULT NOTE  GYN Consultation  Attending Provider: Vanessa Burke, MD   Dawsyn Ehrhard QL:[id number] 08/11/2022 11:33 AM    Reason for Consultation:   Carol Frye is a 31 y.o. G2P1001 premenopausal female who is at [redacted]w[redacted]d gestation who is seen at the request of Dr. Jari Pigg for evaluation of right adnexal mass and possible hyperemesis gravidarum.    History of Present Ilness:   She reports discomfort for several days. However, over the past 24 hours she had developed acute right lower quadrant pain that feel like a pressure and is dull. The pain does not radiate. The pain is constant and moderate-severe. Nothing alleviates the pain. Nothing aggravates the pain. She has no specifically associated symptoms, apart from possible UTI-like symptoms.   She had a pelvic ultrasound in ED on 08/03/2022 and this showed a normal intrauterine pregnancy without any adnexal lesions. The limitation of this study is that it was performed transabdominally. She had an ultrasound today that showed a 9 cm complex echogenic and nonvascular area adjacent to the ovary that is suspicious but not definitive for hematoma. The area is well defined.  There is maintained blood flow within the right ovary.  For the past three weeks she has been having severe nausea and this is her second presentation to the ER since 08/03/2022.  She has been taking Zofran. But, for several days she has been unable to keep any food down. She has been able to keep some sips of clear liquids down.  She has been having some epigastric pain associated with the nausea and emesis.  She had ketones in her urine today. Her potassium is 2.9 today.    Past Medical History: No Known Medical problems.  Past Surgical History:  Procedure Laterality Date   NO PAST SURGERIES     OTHER SURGICAL HISTORY     patient was put to sleep for suture repair of face after dog attack at age of 7 years   Allergies  Allergen Reactions   Sulfa Antibiotics     Throat  swelling    Mushroom Extract Complex Rash   Prior to Admission medications   Medication Sig Start Date End Date Taking? Authorizing Provider  Prenatal Vit-Fe Fumarate-FA (PRENATAL MULTIVITAMIN) TABS tablet Take 1 tablet by mouth daily at 12 noon.    [provider]  Zofran  Obstetric History: She is a G73P1001 female s/p SVD x 1, Currently pregnant with a singleton IUP at [redacted]w[redacted]d gestation.     Social History:  She  reports that she has never smoked. She has never used smokeless tobacco. She reports current alcohol use. She reports that she does not use drugs.  Family History:  family history includes Cancer in her father.   Review of Systems  Constitutional:  Positive for malaise/fatigue.  HENT: Negative.    Eyes: Negative.   Respiratory: Negative.    Cardiovascular: Negative.   Gastrointestinal:  Positive for abdominal pain, heartburn, nausea and vomiting.  Genitourinary: Negative.   Musculoskeletal: Negative.   Skin: Negative.   Neurological: Negative.   Psychiatric/Behavioral: Negative.        Objective    BP 125/67 (BP Location: Right Arm)   Pulse 91   Temp 97.6 F (36.4 C)   Resp 16   Ht 5\' 9"  (1.753 m)   Wt 95.7 kg   SpO2 100%   BMI 31.16 kg/m  Physical Exam Constitutional:      General: She is not in acute distress.  Appearance: Normal appearance. She is well-developed.  HENT:     Head: Normocephalic and atraumatic.  Eyes:     General: No scleral icterus.    Conjunctiva/sclera: Conjunctivae normal.  Cardiovascular:     Rate and Rhythm: Normal rate and regular rhythm.     Heart sounds: No murmur heard.    No friction rub. No gallop.  Pulmonary:     Effort: Pulmonary effort is normal. No respiratory distress.     Breath sounds: Normal breath sounds. No wheezing or rales.  Abdominal:     General: Bowel sounds are normal. There is no distension.     Palpations: Abdomen is soft. There is no mass.     Tenderness: There is abdominal tenderness  (RLQ ttp). There is no guarding or rebound.  Musculoskeletal:        General: Normal range of motion.     Cervical back: Normal range of motion and neck supple.  Neurological:     General: No focal deficit present.     Mental Status: She is alert and oriented to person, place, and time.     Cranial Nerves: No cranial nerve deficit.  Skin:    General: Skin is warm and dry.     Findings: No erythema.  Psychiatric:        Mood and Affect: Mood normal.        Behavior: Behavior normal.        Judgment: Judgment normal.      Laboratory Results:   Lab Results  Component Value Date   WBC 16.5 (H) 08/11/2022   RBC 4.62 08/11/2022   HGB 13.0 08/11/2022   HCT 37.3 08/11/2022   PLT 338 08/11/2022   NA 134 (L) 08/11/2022   K 2.9 (L) 08/11/2022   CREATININE 0.55 08/11/2022   Lab Results  Component Value Date   PREGTESTUR POSITIVE (A) 08/03/2022    Imaging Results:  US OB LESS THAN 14 WEEKS WITH OB TRANSVAGINAL  Addendum Date: 08/11/2022   ADDENDUM REPORT: 08/11/2022 09:05 ADDENDUM: Study discussed by telephone with PA SUSAN FISHER on 08/11/2022 at 0852 hours. She advises this patient did NOT undergo fertility treatments for this pregnancy, but still Heterotopic Pregnancy can rarely occur. Electronically Signed   By: Genevie Ann M.D.   On: 08/11/2022 09:05   Result Date: 08/11/2022 CLINICAL DATA:  32 year old female with pain in the 1st trimester of pregnancy. Recent Ob ultrasound with no adverse features. EXAM: OBSTETRIC <14 WK [place] AND TRANSVAGINAL OB US TECHNIQUE: Both transabdominal and transvaginal ultrasound examinations were performed for complete evaluation of the gestation as well as the maternal uterus, adnexal regions, and pelvic cul-de-sac. Transvaginal technique was performed to assess early pregnancy. COMPARISON:  08/03/2022. FINDINGS: Intrauterine gestational sac: Single Yolk sac:  Visible Embryo:  Visible Cardiac Activity: Detected Heart Rate: 171 bpm CRL: 23.7 mm 9 w 1 d,  concordant with the 08/03/2022 comparison [place] EDC: 03/15/2023 Subchorionic hemorrhage:  None visualized. Maternal uterus/adnexae: Small volume of free fluid in the cul-de-sac appears faily simple. The left ovary appears stable and within normal limits with probable corpus luteum (image 40). The left ovary is 4.3 x 2.4 by 2.1 cm. But the right adnexa region is now abnormal, with a large up to 9 cm complex echogenic and nonvascular area adjacent to the ovary (series 1, image 27) suspicious for hematoma. The ovarian parenchyma is suspected on series 1, image 36 and demonstrates small follicles and preserved Doppler vascularity. The right ovary there is roughly 3-4  cm and contiguous with the complex echogenic area. IMPRESSION: 1. New large roughly 9 cm complex fluid collection at the right adnexa inseparable from the right ovary. But there is maintained Doppler vascularity within the ovary which also demonstrates several small follicles. This is indeterminate, possibly hemorrhage/hematoma. Consider rare Heterotopic Pregnancy. Recommend OB GYN consultation. 2. Stable, viable IUP with appropriate growth since 08/03/2022, estimated gestational age now 44 weeks and 1 day. 3. Left ovary remains normal. Electronically Signed: By: Genevie Ann M.D. On: 08/11/2022 08:47      Assessment & Recommendations   Gracia Franey is a 31 y.o. G2P1001 premenopausal female at [redacted]w[redacted]d gestation with a right adnexal mass that is new over the past 8 days. I had a long discussion with the patient regarding the nature of the lesion.We discussed that this could be a self-limiting issue and it will resolve itself.  Based on the characterization of the lesion on ultrasound, I can't tell her exactly what the lesion is. Because it appears self-contained, there is some concern that there is a cystic process going on.  Also, while very rare, a heterotopic pregnancy has to be considered.  We also discussed a surgery to remove whatever the lesion is (blood,  cyst).  She prefers this option. She understands that while in pregnancy and first trimester surgery is generally safe. However, there are risks with any surgery and while small there is some risk of danger to her pregnancy.  She voiced understanding and agreement to proceed.    Recommendations:  1. To OR for assessment as above. Discussed risks of surgery, such as risk of bleeding, infection, damage to nearby organs such as bowel, bladder, nerves, blood vessels, low risk to pregnancy, risk of anesthesia. We discussed that this not an exhaustive list.  She voiced understanding and agreement to proceed.  2.  Replete potassium as much as possible prior to surgery. 3. Admit for hyperemesis post-op  Prentice Docker, MD 08/11/2022 11:33 AM

## 2022-08-11 NOTE — Anesthesia Procedure Notes (Signed)
Procedure Name: Intubation Date/Time: 08/11/2022 4:34 PM  Performed by: Beverely Low, CRNAPre-anesthesia Checklist: Patient identified, Patient being monitored, Timeout performed, Emergency Drugs available and Suction available Patient Re-evaluated:Patient Re-evaluated prior to induction Oxygen Delivery Method: Circle system utilized Preoxygenation: Pre-oxygenation with 100% oxygen Induction Type: IV induction Ventilation: Mask ventilation without difficulty Laryngoscope Size: 3 and McGraph Grade View: Grade I Tube type: Oral Tube size: 7.0 mm Number of attempts: 1 Airway Equipment and Method: Stylet Placement Confirmation: ETT inserted through vocal cords under direct vision, positive ETCO2 and breath sounds checked- equal and bilateral Secured at: 21 cm Tube secured with: Tape Dental Injury: Teeth and Oropharynx as per pre-operative assessment

## 2022-08-11 NOTE — Op Note (Signed)
Pre-Operative Diagnosis:  1) Right lower quadrant mass 2) Right lower quadrant abdominal pain [R10.31, G89.29]   Post-Operative Diagnosis:  1) Colonic/ileal  mass  2) Right lower quadrant abdominal pain [R10.31, G89.29]   Procedures:  Robot assisted diagnostic laparoscopy   Primary Surgeon: Benjamine Sprague, DO    EBL: minimal    IVF: 1,000 mL    Urine output: 200 mL   Specimens: none   Drains: none   Complications: None    Disposition: PACU    Condition: Stable    Findings:  1)  Gravid uterus consistent with gestational age 63) Mass of terminal ileum and cecum  with adhesions to right fallopian tube and ovary, omentum 3) appendix unable to be visualized  Indication for procedure: Intraoperative consultation from Dr. Glennon Mac for right lower quadrant mass.  Patient is 9-week 1 day gestation female presenting with right lower quadrant pain and nausea.  Imaging work-up was nondiagnostic so Dr. Glennon Mac proceeded with diagnostic laparoscopy.  I was consulted Intra-Op after discovery of this right lower quadrant mass.  Further inspection laparoscopically noted 1 cm likely enlarged terminal ileum.  The ileum had an abrupt tapering back to normal caliber bowel more proximally, and seem to be covered by a layer of omentum hiding the ileocecal valve and cecum.  Abdominal wall attachments were noted along the lateral aspect of the right colon, which was normal in caliber.  Appendix was not able to be visualized laparoscopically.  The robot was docked at this point for better visualization and dexterity to further explore the area.  Further exploration and manipulation of the area did not visualize an appendix, nor able to find a adequate plane to separate what seems to be the small bowel mesentery, bowel lumen, right colon.  The right ovary and fallopian tubes were able to be visualized and noted to be distinctly separate from what seems to be a intraluminal mass within the terminal  ileum.  Decision was made at this point to abort the procedure due to the risk of possible bowel injury with further dissection.  Procedure was handed over to Dr. Glennon Mac.

## 2022-08-11 NOTE — Progress Notes (Signed)
Patient back to room.

## 2022-08-11 NOTE — Progress Notes (Signed)
Patient to MRI via bed pushed by transporter.

## 2022-08-11 NOTE — Anesthesia Postprocedure Evaluation (Signed)
Anesthesia Post Note  Patient: Carol Frye  Procedure(s) Performed: XI ROBOTIC ASSISTED DIAGNOSTIC LAPAROSCOPY (Abdomen)  Patient location during evaluation: PACU Anesthesia Type: General Level of consciousness: awake and alert Pain management: pain level controlled Vital Signs Assessment: post-procedure vital signs reviewed and stable Respiratory status: spontaneous breathing, nonlabored ventilation and respiratory function stable Cardiovascular status: blood pressure returned to baseline and stable Postop Assessment: no apparent nausea or vomiting Anesthetic complications: no   No notable events documented.   Last Vitals:  Vitals:   08/11/22 1920 08/11/22 1925  BP: 122/64 119/64  Pulse: 83 75  Resp: 17 16  Temp:    SpO2: 99% 99%    Last Pain:  Vitals:   08/11/22 1920  TempSrc:   PainSc: 0-No pain                 [place] Ouch

## 2022-08-11 NOTE — ED Notes (Signed)
Resting.  States "I'm feeling much better".  No Nausea or vomiting at this time.  IVF infusing, 600 ml infused.  Spouse remains with patient.  Skin warm and dry.

## 2022-08-11 NOTE — Consult Note (Signed)
Intraoperative consultation from Dr. Glennon Mac for right lower quadrant mass.  Patient is 9-week 1 day gestation female presenting with right lower quadrant pain and nausea.  Imaging work-up was nondiagnostic so Dr. Glennon Mac proceeded with diagnostic laparoscopy.  Please see op note for further details.  Postop, discussed findings with husband and he is agreeable with proceeding with an MRI for further investigation.  Discussed case with Dr. Francoise Ceo radiologist and she recommended MRI abdomen without contrast.

## 2022-08-12 ENCOUNTER — Encounter: Payer: Self-pay | Admitting: Obstetrics and Gynecology

## 2022-08-12 DIAGNOSIS — O211 Hyperemesis gravidarum with metabolic disturbance: Secondary | ICD-10-CM

## 2022-08-12 LAB — CBC
HCT: 31.3 % — ABNORMAL LOW (ref 36.0–46.0)
Hemoglobin: 10.6 g/dL — ABNORMAL LOW (ref 12.0–15.0)
MCH: 28 pg (ref 26.0–34.0)
MCHC: 33.9 g/dL (ref 30.0–36.0)
MCV: 82.8 fL (ref 80.0–100.0)
Platelets: 278 10*3/uL (ref 150–400)
RBC: 3.78 MIL/uL — ABNORMAL LOW (ref 3.87–5.11)
RDW: 11.8 % (ref 11.5–15.5)
WBC: 9.8 10*3/uL (ref 4.0–10.5)
nRBC: 0 % (ref 0.0–0.2)

## 2022-08-12 LAB — URINE CULTURE: Culture: <10000 — AB

## 2022-08-12 LAB — COMPREHENSIVE METABOLIC PANEL
ALT: 17 U/L (ref 0–44)
AST: 12 U/L — ABNORMAL LOW (ref 15–41)
Albumin: 2.6 g/dL — ABNORMAL LOW (ref 3.5–5.0)
Alkaline Phosphatase: 77 U/L (ref 38–126)
Anion gap: 4 — ABNORMAL LOW (ref 5–15)
BUN: 5 mg/dL — ABNORMAL LOW (ref 6–20)
CO2: 21 mmol/L — ABNORMAL LOW (ref 22–32)
Calcium: 8 mg/dL — ABNORMAL LOW (ref 8.9–10.3)
Chloride: 113 mmol/L — ABNORMAL HIGH (ref 98–111)
Creatinine, Ser: 0.42 mg/dL — ABNORMAL LOW (ref 0.44–1.00)
GFR, Estimated: >60 mL/min (ref >60)
Glucose, Bld: 158 mg/dL — ABNORMAL HIGH (ref 70–99)
Potassium: 3.7 mmol/L (ref 3.5–5.1)
Sodium: 138 mmol/L (ref 135–145)
Total Bilirubin: 0.4 mg/dL (ref 0.3–1.2)
Total Protein: 6.1 g/dL — ABNORMAL LOW (ref 6.5–8.1)

## 2022-08-12 NOTE — Progress Notes (Signed)
ANTEPARTUM PROGRESS NOTE  Carol Frye is a 31 y.o. G2P1001 at [redacted]w[redacted]d with EDC of Estimated Date of Delivery: 03/15/23 who is admitted for RLQ pain and hyperemesis.   Length of Stay:  1 Days. Admitted 08/11/2022  Subjective: Feels okay this morning other than concern about the mass in her pelvis. Nausea and pain are well controlled.  Patient reports good fetal movement.  She reports no bleeding and no loss of fluid per vagina.  Vitals:  BP 106/66 (BP Location: Left Arm)   Pulse 93   Temp 97.8 F (36.6 C) (Oral)   Resp 20   Ht 5\' 9"  (1.753 m)   Wt 94.6 kg   LMP 06/08/2022   SpO2 99%   Breastfeeding Unknown   BMI 30.80 kg/m   Physical Examination: CONSTITUTIONAL: Well-developed, well-nourished female in no acute distress.  HENT:  Normocephalic, atraumatic SKIN: Skin is warm and dry. NEUROLGIC: Alert and oriented to person, place, and time PSYCHIATRIC: Normal mood and affect.  CARDIOVASCULAR: Normal heart rate noted, regular rhythm RESPIRATORY: Effort and breath sounds normal MUSCULOSKELETAL: Normal range of motion.  ABDOMEN: Soft, nondistended; mildly tender RLQ on exam.  Pelvic: deferred    Results for orders placed or performed during the hospital encounter of 08/11/22 (from the past 48 hour(s))  Lipase, blood     Status: None   Collection Time: 08/11/22  7:44 AM  Result Value Ref Range   Lipase 24 11 - 51 U/L    Comment: Performed at [facility], [address] [place], [place] [address]  Comprehensive metabolic panel     Status: Abnormal   Collection Time: 08/11/22  7:44 AM  Result Value Ref Range   Sodium 134 (L) 135 - 145 mmol/L   Potassium 2.9 (L) 3.5 - 5.1 mmol/L   Chloride 100 98 - 111 mmol/L   CO2 19 (L) 22 - 32 mmol/L   Glucose, Bld 122 (H) 70 - 99 mg/dL    Comment: Glucose reference range applies only to samples taken after fasting for at least 8 hours.   BUN <5 (L) 6 - 20 mg/dL   Creatinine, Ser 3.81 0.44 - 1.00 mg/dL   Calcium 9.5 8.9 - 82.9  mg/dL   Total Protein 7.9 6.5 - 8.1 g/dL   Albumin 3.3 (L) 3.5 - 5.0 g/dL   AST 19 15 - 41 U/L   ALT 26 0 - 44 U/L   Alkaline Phosphatase 98 38 - 126 U/L   Total Bilirubin 1.2 0.3 - 1.2 mg/dL   GFR, Estimated >93 >71 mL/min    Comment: (NOTE) Calculated using the CKD-EPI Creatinine Equation (2021)    Anion gap 15 5 - 15    Comment: Performed at [facility], [address] [place], [place] [address]  CBC     Status: Abnormal   Collection Time: 08/11/22  7:44 AM  Result Value Ref Range   WBC 16.5 (H) 4.0 - 10.5 K/uL   RBC 4.62 3.87 - 5.11 MIL/uL   Hemoglobin 13.0 12.0 - 15.0 g/dL   HCT 93.8 10.1 - 75.1 %   MCV 80.7 80.0 - 100.0 fL   MCH 28.1 26.0 - 34.0 pg   MCHC 34.9 30.0 - 36.0 g/dL   RDW 02.5 85.2 - 77.8 %   Platelets 338 150 - 400 K/uL   nRBC 0.0 0.0 - 0.2 %    Comment: Performed at [facility], [address], [place], [place] [address]  hCG, quantitative, pregnancy  Status: Abnormal   Collection Time: 08/11/22  7:44 AM  Result Value Ref Range   hCG, Beta Chain, Quant, S 91,012 (H) <5 mIU/mL    Comment:          GEST. AGE      CONC.  (mIU/mL)   <=1 WEEK        5 - 50     2 WEEKS       50 - 500     3 WEEKS       100 - 10,000     4 WEEKS     1,000 - 30,000     5 WEEKS     3,500 - 115,000   6-8 WEEKS     12,000 - 270,000    12 WEEKS     15,000 - 220,000        FEMALE AND NON-PREGNANT FEMALE:     LESS THAN 5 mIU/mL Performed at [facility], [address] [place], [place] [address]   ABO/Rh     Status: None   Collection Time: 08/11/22  7:44 AM  Result Value Ref Range   ABO/RH(D)      A POS Performed at [facility], [address] [place], [place] [address]   Magnesium     Status: None   Collection Time: 08/11/22  7:44 AM  Result Value Ref Range   Magnesium 1.8 1.7 - 2.4 mg/dL    Comment: Performed at [facility] [address] [place], [place] [address]  Urinalysis, Routine w reflex microscopic      Status: Abnormal   Collection Time: 08/11/22  8:37 AM  Result Value Ref Range   Color, Urine YELLOW (A) YELLOW   APPearance HAZY (A) CLEAR   Specific Gravity, Urine 1.026 1.005 - 1.030   pH 6.0 5.0 - 8.0   Glucose, UA NEGATIVE NEGATIVE mg/dL   Hgb urine dipstick NEGATIVE NEGATIVE   Bilirubin Urine NEGATIVE NEGATIVE   Ketones, ur 80 (A) NEGATIVE mg/dL   Protein, ur 100 (A) NEGATIVE mg/dL   Nitrite NEGATIVE NEGATIVE   Leukocytes,Ua MODERATE (A) NEGATIVE   RBC / HPF 0-5 0 - 5 RBC/hpf   WBC, UA 11-20 0 - 5 WBC/hpf   Bacteria, UA MANY (A) NONE SEEN   Squamous Epithelial / LPF 0-5 0 - 5   Mucus PRESENT     Comment: Performed at [facility], [address] [place], [place] [address]  Urine Culture     Status: Abnormal   Collection Time: 08/11/22  8:37 AM   Specimen: Urine, Clean Catch  Result Value Ref Range   Specimen Description      URINE, CLEAN CATCH Performed at [facility], [address], [place], [place] [address]    Special Requests      NONE Performed at [facility], [address] [place], [place] [address]    Culture (A)     <10,000 COLONIES/mL INSIGNIFICANT GROWTH Performed at [facility] [address], [place], [place] [address]    Report Status 08/12/2022 FINAL   Potassium     Status: Abnormal   Collection Time: 08/11/22  2:08 PM  Result Value Ref Range   Potassium 3.0 (L) 3.5 - 5.1 mmol/L    Comment: Performed at [facility], [address] [place],  [address]  CBC     Status: Abnormal   Collection Time: 08/12/22  6:33 AM  Result Value Ref Range  WBC 9.8 4.0 - 10.5 K/uL   RBC 3.78 (L) 3.87 - 5.11 MIL/uL   Hemoglobin 10.6 (L) 12.0 - 15.0 g/dL   HCT 31.3 (L) 36.0 - 46.0 %   MCV 82.8 80.0 - 100.0 fL   MCH 28.0 26.0 - 34.0 pg   MCHC 33.9 30.0 - 36.0 g/dL   RDW 11.8 11.5 - 15.5 %   Platelets 278 150 - 400 K/uL   nRBC 0.0 0.0 - 0.2 %    Comment: Performed at [facility], [address] [place], [place] [address]  Comprehensive metabolic panel     Status: Abnormal   Collection Time: 08/12/22  6:33 AM  Result Value Ref Range   Sodium 138 135 - 145 mmol/L   Potassium 3.7 3.5 - 5.1 mmol/L   Chloride 113 (H) 98 - 111 mmol/L   CO2 21 (L) 22 - 32 mmol/L   Glucose, Bld 158 (H) 70 - 99 mg/dL    Comment: Glucose reference range applies only to samples taken after fasting for at least 8 hours.   BUN 5 (L) 6 - 20 mg/dL   Creatinine, Ser 0.42 (L) 0.44 - 1.00 mg/dL   Calcium 8.0 (L) 8.9 - 10.3 mg/dL   Total Protein 6.1 (L) 6.5 - 8.1 g/dL   Albumin 2.6 (L) 3.5 - 5.0 g/dL   AST 12 (L) 15 - 41 U/L   ALT 17 0 - 44 U/L   Alkaline Phosphatase 77 38 - 126 U/L   Total Bilirubin 0.4 0.3 - 1.2 mg/dL   GFR, Estimated >60 >60 mL/min    Comment: (NOTE) Calculated using the CKD-EPI Creatinine Equation (2021)    Anion gap 4 (L) 5 - 15    Comment: Performed at [facility], [address] [place], [place] [address]    Current scheduled medications  docusate sodium  100 mg Oral BID   pyridOXINE  25 mg Oral BID   And   doxylamine (Sleep)  25 mg Oral BID   famotidine  20 mg Oral Q12H    I have reviewed the patient's current medications.  ASSESSMENT: Patient Active Problem List   Diagnosis Date Noted   Hyperemesis gravidarum to [redacted] weeks gestation, electrolyte imbalance 08/11/2022   Colonic mass 08/11/2022   Abdominal pain, chronic, right lower quadrant 08/11/2022   Right lower quadrant abdominal mass 08/11/2022   Indication for care in labor or delivery 05/23/2021   Uterine contractions 05/02/2021   Amniotic fluid leaking 04/30/2021   Pregnancy 04/30/2021    PLAN: Gen Surgery consult for further eval of RLQ mass  Pt reports her nausea is well controlled and pain is controlled. Stable from OB standpoint/HG. Remains NPO with IV fluids infusing.    Francetta Found, CNM 08/12/2022  1:41 PM

## 2022-08-12 NOTE — Progress Notes (Signed)
Daily Benign Gynecology Progress Note Carol Frye  [id number]  HD#2/POD#1  Chief complaint: uncontrolled vomiting, right lower quadrant pain  Subjective:  Overnight Events: No acute events Complaints: none currently. Mild nausea occasionally She denies: fevers, chills, chest pain, trouble breathing, vomiting, severe abdominal pain.  She has tolerated: a few sips of clears. She reports symptoms of ptyalism. The water helps with this.  She reports her pain is well controlled with norco used infrequently.   She is ambulating and is voiding.   Objective:  Most recent vitals Temp: 97.8 F (36.6 C)  BP: 106/66  Pulse Rate: 93  Resp: 20  SpO2: 99 %   Vitals Range over 24 hours Temp  Avg: 98.2 F (36.8 C)  Min: 97.5 F (36.4 C)  Max: 99.2 F (37.3 C) BP  Min: 103/56  Max: 127/60 Pulse  Avg: 77.3  Min: 67  Max: 93 SpO2  Avg: 99.3 %  Min: 98 %  Max: 100 %   Urine Output: 900 mL over 24 hours.    Physical Exam General: alert, well appearing, and in no distress Heart: regular rate and rhythm, no murmurs Lungs: clear to auscultation, no wheezes, rales or rhonchi, symmetric air entry Abdomen: abdomen is soft without significant tenderness, masses, organomegaly or guarding. Incision: all clean/dry/intact Extremities: no redness or tenderness in the calves or thighs, no edema  AM Labs Lab Results  Component Value Date   WBC 9.8 08/12/2022   HGB 10.6 (L) 08/12/2022   HCT 31.3 (L) 08/12/2022   PLT 278 08/12/2022   NA 138 08/12/2022   K 3.7 08/12/2022   CREATININE 0.42 (L) 08/12/2022   BUN 5 (L) 08/12/2022     Assessment:  Carol Frye is a 31 y.o. female HD#2/POD#1 s/p diagnostic laparoscopy with gynecology and general surgery.  She meets criteria for hyperemesis gravidarum. She also has a colonic mass of indeterminate significance. For this she is being managed by general surgery.   Plan:  Pain Control:  Norco, as needed GI: per surgery GU:  no issues. UOP adequate Nutrition:   may have clears today as tolerated. Will bring back to NPO, if doesn't tolerate.  Hypokalemia repletion has corrected her level.  IVF: MIVF at 125 mL/hr DVT Prophylaxis:  SCDs Activity: as tolerated Anticipated Discharge: pending response to hyperemesis treatment, about 2 days.  Prentice Docker, MD  08/12/2022 2:12 PM

## 2022-08-12 NOTE — Progress Notes (Signed)
Subjective:  CC: Carol Frye is a 31 y.o. female  Hospital stay day 1, 1 Day Post-Op diagnostic laparoscopy  HPI: No acute issues today.  Degree of nausea has improved since admission.  Soreness around incision sites.  ROS:  General: Denies weight loss, weight gain, fatigue, fevers, chills, and night sweats. Heart: Denies chest pain, palpitations, racing heart, irregular heartbeat, leg pain or swelling, and decreased activity tolerance. Respiratory: Denies breathing difficulty, shortness of breath, wheezing, cough, and sputum. GI: Denies change in appetite, heartburn, nausea, vomiting, constipation, diarrhea, and blood in stool. GU: Denies difficulty urinating, pain with urinating, urgency, frequency, blood in urine.   Objective:   Temp:  [97.6 F (36.4 C)-98.7 F (37.1 C)] 97.6 F (36.4 C) (10/14 2000) Pulse Rate:  [66-93] 89 (10/14 2000) Resp:  [17-20] 20 (10/14 2000) BP: (103-119)/(56-66) 113/59 (10/14 2000) SpO2:  [96 %-100 %] 96 % (10/14 2000) Weight:  [94.6 kg] 94.6 kg (10/14 0640)     Height: 5\' 9"  (175.3 cm) Weight: 94.6 kg BMI (Calculated): 30.79   Intake/Output this shift:   Intake/Output Summary (Last 24 hours) at 08/12/2022 2126 Last data filed at 08/12/2022 2049 Gross per 24 hour  Intake 4677.59 ml  Output 2150 ml  Net 2527.59 ml    Constitutional :  alert, cooperative, appears stated age, and no distress  Respiratory:  clear to auscultation bilaterally  Cardiovascular:  regular rate and rhythm  Gastrointestinal: Soft, no guarding, incisions clean dry and intact .   Skin: Cool and moist.   Psychiatric: Normal affect, non-agitated, not confused       LABS:     Latest Ref Rng & Units 08/12/2022    6:33 AM 08/11/2022    2:08 PM 08/11/2022    7:44 AM  CMP  Glucose 70 - 99 mg/dL 158   122   BUN 6 - 20 mg/dL 5   <5   Creatinine 0.44 - 1.00 mg/dL 0.42   0.55   Sodium 135 - 145 mmol/L 138   134   Potassium 3.5 - 5.1 mmol/L 3.7  3.0  2.9   Chloride 98 - 111  mmol/L 113   100   CO2 22 - 32 mmol/L 21   19   Calcium 8.9 - 10.3 mg/dL 8.0   9.5   Total Protein 6.5 - 8.1 g/dL 6.1   7.9   Total Bilirubin 0.3 - 1.2 mg/dL 0.4   1.2   Alkaline Phos 38 - 126 U/L 77   98   AST 15 - 41 U/L 12   19   ALT 0 - 44 U/L 17   26       Latest Ref Rng & Units 08/12/2022    6:33 AM 08/11/2022    7:44 AM 08/03/2022    7:35 AM  CBC  WBC 4.0 - 10.5 K/uL 9.8  16.5  10.9   Hemoglobin 12.0 - 15.0 g/dL 10.6  13.0  12.8   Hematocrit 36.0 - 46.0 % 31.3  37.3  37.2   Platelets 150 - 400 K/uL 278  338  291     RADS: CLINICAL DATA:  Pregnant patient with right lower quadrant mass versus appendiceal abscess.   EXAM: MRI ABDOMEN AND PELVIS WITHOUT CONTRAST   TECHNIQUE: Multiplanar multisequence MR imaging of the abdomen and pelvis was performed. No intravenous contrast was administered.   COMPARISON:  Ultrasound 08/11/2022   FINDINGS: COMBINED FINDINGS FOR BOTH MR ABDOMEN AND PELVIS   Lower chest: Lung bases are grossly  clear.   Hepatobiliary: No mass or other parenchymal abnormality identified.   Pancreas: No mass, inflammatory changes, or other parenchymal abnormality identified.   Spleen:  Within normal limits in size and appearance.   Adrenals/Urinary Tract: No masses identified. No evidence of hydronephrosis.   Stomach/Bowel: Stomach, small bowel, and colon are not abnormally distended. No discrete wall thickening is identified. As seen on previous ultrasound, there is a heterogeneous mass in the right lower quadrant measuring 6.1 x 8.2 cm in diameter and containing fluid and soft tissue components. Circumscribed very low signal intensity within the lesion could represent appendicoliths. The lesion deforms the inferior cecum. Most likely this represents a large appendiceal abscess with appendicoliths and surrounding edema. Differential considerations might also include an appendiceal mass or terminal ileal mass.   Vascular/Lymphatic: No  pathologically enlarged lymph nodes identified. No abdominal aortic aneurysm demonstrated.   Reproductive: Uterus is anteverted. A single intrauterine gestational sac is present with fetal pole identified within the uterus. The left ovary measures about 2.3 x 4.3 cm and contains a simple appearing cyst, likely corpus luteal cyst. The right ovary is demonstrated separately from the right lower quadrant mass with normal follicular changes demonstrated. The right ovary measures 1.7 x 2.8 cm.   Other:  Moderate free fluid in the pelvis is likely reactive.   Musculoskeletal: No suspicious bone lesions identified.   IMPRESSION: 1. There is a large heterogeneous masslike process demonstrated in the right lower quadrant deforming the cecum. Fluid and soft tissue components are present with probable calcium. The right ovary is demonstrated separately from this area. This most likely represents a large appendiceal abscess with appendicoliths. Less likely considerations might include an appendiceal or terminal ileal mass. 2. Both ovaries are visualized and appear normal. Corpus luteal cyst on the left. 3. Anteverted uterus with single intrauterine pregnancy demonstrated. The fetal pole is visualized. 4. Moderate free fluid in the abdomen and pelvis.   Electronically Signed: By: Burman Nieves M.D. On: 08/11/2022 23:27   Assessment:   Right lower quadrant mass.  Independent review of the imaging as well as cross reference to intraoperative findings makes mass within the terminal ileum the most likely differential diagnosis at this point.  Specific tumor diagnosis differential includes carcinoid tumor.  Labs were ordered per discussion with oncology.    Octreotide scan unfortunately seems to be contraindicated in pregnancy.  Proceeding with endoscopy will be the next step if all blood work remains nondiagnostic, although proceeding with a endoscopy during pregnancy will carry risks to the  fetus.    Discussed case with patient extensively and she is agreeable to exhausting all noninvasive testing and diagnosis prior to proceeding with the endoscopy and attempt to visualize this area.  There still is some concern that even an endoscopy may not be able to fully visualize the area due to the potential of this mass compressing the cecum.  labs/images/medications/previous chart entries reviewed personally and relevant changes/updates noted above.

## 2022-08-12 NOTE — Consult Note (Addendum)
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  Work-up included imaging that noted a right adnexal mass.  Patient reports since April prior to her pregnancy she would have periodic episodes of diarrhea that were associated with flushing.  She reports these were unpredictable and no real pattern.  They have continued on an intermittent basis since she became pregnant.  She has otherwise felt well.  She has no neurologic complaints.  She denies any fevers.  She has good appetite and is gaining weight appropriately.  She has no chest pain, shortness of breath, cough, or hemoptysis.  She has no urinary complaints.  Patient offers no further specific complaints.  REVIEW OF SYSTEMS:   Review of Systems  Constitutional:  Positive for diaphoresis. Negative for fever, malaise/fatigue and weight loss.  Respiratory: Negative.  Negative for cough, hemoptysis and shortness of breath.   Cardiovascular: Negative.  Negative for chest pain and leg swelling.  Gastrointestinal:  Positive for diarrhea, nausea and vomiting.  Genitourinary: Negative.  Negative for dysuria.  Musculoskeletal: Negative.  Negative for back pain.  Skin: Negative.  Negative for rash.  Neurological: Negative.  Negative for dizziness, focal weakness, weakness and headaches.  Psychiatric/Behavioral: Negative.  The patient is not nervous/anxious.     As per HPI. Otherwise, a complete review of systems is negative.  PAST MEDICAL HISTORY: History reviewed. No pertinent past medical history.  PAST SURGICAL HISTORY: Past Surgical History:   Procedure Laterality Date   NO PAST SURGERIES     OTHER SURGICAL HISTORY     patient was put to sleep for suture repair of face after dog attack at age of 7 years   ROBOTIC ASSISTED LAPAROSCOPIC OVARIAN CYSTECTOMY N/A 08/11/2022   Procedure: XI ROBOTIC ASSISTED DIAGNOSTIC LAPAROSCOPY;  Surgeon: Conard NovakJackson, Stephen D, MD;  Location: [facility] ORS;  Service: Gynecology;  Laterality: N/A;    FAMILY HISTORY: Family History  Problem Relation Age of Onset   Cancer Father     ADVANCED DIRECTIVES (Y/N):  @ADVDIR @  HEALTH MAINTENANCE: Social History   Tobacco Use   Smoking status: Never   Smokeless tobacco: Never  Vaping Use   Vaping Use: Never used  Substance Use Topics   Alcohol use: Yes   Drug use: Never     Colonoscopy:  PAP:  Bone density:  Lipid panel:  Allergies  Allergen Reactions   Sulfa Antibiotics     Throat swelling    Mushroom Extract Complex Rash    Current Facility-Administered Medications  Medication Dose Route Frequency Provider Last Rate Last Admin   dextrose 5 % and 0.9 % NaCl with KCl 40 mEq/L infusion   Intravenous Continuous Conard NovakJackson, Stephen D, MD 150 mL/hr at 08/12/22 1948 New Bag at 08/12/22 1948   docusate sodium (COLACE) capsule 100 mg  100 mg Oral BID Conard NovakJackson, Stephen D, MD       pyridOXINE (VITAMIN B6) tablet 25 mg  25 mg Oral BID Conard NovakJackson, Stephen D, MD       And   doxylamine (Sleep) (UNISOM) tablet 25 mg  25 mg Oral BID Conard NovakJackson, Stephen D, MD       famotidine (PEPCID) tablet 20 mg  20 mg Oral Q12H Conard Novak, MD       Or   famotidine (PEPCID) IVPB 20 mg premix  20 mg Intravenous Q12H Conard Novak, MD 100 mL/hr at 08/12/22 1322 20 mg at 08/12/22 1322   HYDROcodone-acetaminophen (NORCO/VICODIN) 5-325 MG per tablet 1-2 tablet  1-2 tablet Oral Q4H PRN Conard Novak, MD   1 tablet at 08/12/22 1544   menthol-cetylpyridinium (CEPACOL) lozenge 3 mg  1 lozenge Oral Q2H PRN Conard Novak, MD   3 mg at 08/12/22 1116   ondansetron  (ZOFRAN) tablet 4 mg  4 mg Oral Q6H PRN Conard Novak, MD       Or   ondansetron [facility]) injection 4 mg  4 mg Intravenous Q6H PRN Conard Novak, MD   4 mg at 08/12/22 1307   simethicone (MYLICON) chewable tablet 80 mg  80 mg Oral QID PRN Conard Novak, MD        OBJECTIVE: Vitals:   08/12/22 1551 08/12/22 2000  BP: 117/60 (!) 113/59  Pulse: 66 89  Resp: 20 20  Temp:  97.6 F (36.4 C)  SpO2: 100% 96%     Body mass index is 30.8 kg/m.    ECOG FS:1 - Symptomatic but completely ambulatory  General: Well-developed, well-nourished, no acute distress. Eyes: Pink conjunctiva, anicteric sclera. HEENT: Normocephalic, moist mucous membranes. Lungs: No audible wheezing or coughing. Heart: Regular rate and rhythm. Abdomen: Soft, nontender, no obvious distention. Musculoskeletal: No edema, cyanosis, or clubbing. Neuro: Alert, answering all questions appropriately. Cranial nerves grossly intact. Skin: No rashes or petechiae noted. Psych: Normal affect. Lymphatics: No cervical, calvicular, axillary or inguinal LAD.   LAB RESULTS:  Lab Results  Component Value Date   NA 138 08/12/2022   K 3.7 08/12/2022   CL 113 (H) 08/12/2022   CO2 21 (L) 08/12/2022   GLUCOSE 158 (H) 08/12/2022   BUN 5 (L) 08/12/2022   CREATININE 0.42 (L) 08/12/2022   CALCIUM 8.0 (L) 08/12/2022   PROT 6.1 (L) 08/12/2022   ALBUMIN 2.6 (L) 08/12/2022   AST 12 (L) 08/12/2022   ALT 17 08/12/2022   ALKPHOS 77 08/12/2022   BILITOT 0.4 08/12/2022   GFRNONAA >60 08/12/2022    Lab Results  Component Value Date   WBC 9.8 08/12/2022   HGB 10.6 (L) 08/12/2022   HCT 31.3 (L) 08/12/2022   MCV 82.8 08/12/2022   PLT 278 08/12/2022     STUDIES: MR ABDOMEN WO CONTRAST  Addendum Date: 08/12/2022   ADDENDUM REPORT: 08/12/2022 00:26 ADDENDUM: These results were called by telephone on 08/12/2022 at 12:13 am to provider Margaretmary Eddy , who verbally acknowledged these results. Electronically Signed   By:  Burman Nieves M.D.   On: 08/12/2022 00:26   Result Date: 08/12/2022 CLINICAL DATA:  Pregnant patient with right lower quadrant mass versus appendiceal abscess. EXAM: MRI ABDOMEN AND PELVIS WITHOUT CONTRAST TECHNIQUE: Multiplanar multisequence MR imaging of the abdomen and pelvis was performed. No intravenous contrast was administered. COMPARISON:  Ultrasound 08/11/2022 FINDINGS: COMBINED FINDINGS FOR BOTH MR ABDOMEN AND PELVIS Lower chest: Lung bases are grossly clear. Hepatobiliary: No mass or other parenchymal abnormality identified. Pancreas: No mass, inflammatory changes, or other parenchymal abnormality identified. Spleen:  Within normal limits in size and appearance. Adrenals/Urinary Tract: No masses identified. No evidence of hydronephrosis. Stomach/Bowel: Stomach, small bowel, and colon are not abnormally distended. No discrete wall thickening is identified. As seen on previous ultrasound, there is a heterogeneous mass in the right  lower quadrant measuring 6.1 x 8.2 cm in diameter and containing fluid and soft tissue components. Circumscribed very low signal intensity within the lesion could represent appendicoliths. The lesion deforms the inferior cecum. Most likely this represents a large appendiceal abscess with appendicoliths and surrounding edema. Differential considerations might also include an appendiceal mass or terminal ileal mass. Vascular/Lymphatic: No pathologically enlarged lymph nodes identified. No abdominal aortic aneurysm demonstrated. Reproductive: Uterus is anteverted. A single intrauterine gestational sac is present with fetal pole identified within the uterus. The left ovary measures about 2.3 x 4.3 cm and contains a simple appearing cyst, likely corpus luteal cyst. The right ovary is demonstrated separately from the right lower quadrant mass with normal follicular changes demonstrated. The right ovary measures 1.7 x 2.8 cm. Other:  Moderate free fluid in the pelvis is likely  reactive. Musculoskeletal: No suspicious bone lesions identified. IMPRESSION: 1. There is a large heterogeneous masslike process demonstrated in the right lower quadrant deforming the cecum. Fluid and soft tissue components are present with probable calcium. The right ovary is demonstrated separately from this area. This most likely represents a large appendiceal abscess with appendicoliths. Less likely considerations might include an appendiceal or terminal ileal mass. 2. Both ovaries are visualized and appear normal. Corpus luteal cyst on the left. 3. Anteverted uterus with single intrauterine pregnancy demonstrated. The fetal pole is visualized. 4. Moderate free fluid in the abdomen and pelvis. Electronically Signed: By: Lucienne Capers M.D. On: 08/11/2022 23:27   MR PELVIS WO CONTRAST  Addendum Date: 08/12/2022   ADDENDUM REPORT: 08/12/2022 00:26 ADDENDUM: These results were called by telephone on 08/12/2022 at 12:13 am to provider Drinda Butts , who verbally acknowledged these results. Electronically Signed   By: Lucienne Capers M.D.   On: 08/12/2022 00:26   Result Date: 08/12/2022 CLINICAL DATA:  Pregnant patient with right lower quadrant mass versus appendiceal abscess. EXAM: MRI ABDOMEN AND PELVIS WITHOUT CONTRAST TECHNIQUE: Multiplanar multisequence MR imaging of the abdomen and pelvis was performed. No intravenous contrast was administered. COMPARISON:  Ultrasound 08/11/2022 FINDINGS: COMBINED FINDINGS FOR BOTH MR ABDOMEN AND PELVIS Lower chest: Lung bases are grossly clear. Hepatobiliary: No mass or other parenchymal abnormality identified. Pancreas: No mass, inflammatory changes, or other parenchymal abnormality identified. Spleen:  Within normal limits in size and appearance. Adrenals/Urinary Tract: No masses identified. No evidence of hydronephrosis. Stomach/Bowel: Stomach, small bowel, and colon are not abnormally distended. No discrete wall thickening is identified. As seen on previous  ultrasound, there is a heterogeneous mass in the right lower quadrant measuring 6.1 x 8.2 cm in diameter and containing fluid and soft tissue components. Circumscribed very low signal intensity within the lesion could represent appendicoliths. The lesion deforms the inferior cecum. Most likely this represents a large appendiceal abscess with appendicoliths and surrounding edema. Differential considerations might also include an appendiceal mass or terminal ileal mass. Vascular/Lymphatic: No pathologically enlarged lymph nodes identified. No abdominal aortic aneurysm demonstrated. Reproductive: Uterus is anteverted. A single intrauterine gestational sac is present with fetal pole identified within the uterus. The left ovary measures about 2.3 x 4.3 cm and contains a simple appearing cyst, likely corpus luteal cyst. The right ovary is demonstrated separately from the right lower quadrant mass with normal follicular changes demonstrated. The right ovary measures 1.7 x 2.8 cm. Other:  Moderate free fluid in the pelvis is likely reactive. Musculoskeletal: No suspicious bone lesions identified. IMPRESSION: 1. There is a large heterogeneous masslike process demonstrated in the right lower quadrant deforming the  cecum. Fluid and soft tissue components are present with probable calcium. The right ovary is demonstrated separately from this area. This most likely represents a large appendiceal abscess with appendicoliths. Less likely considerations might include an appendiceal or terminal ileal mass. 2. Both ovaries are visualized and appear normal. Corpus luteal cyst on the left. 3. Anteverted uterus with single intrauterine pregnancy demonstrated. The fetal pole is visualized. 4. Moderate free fluid in the abdomen and pelvis. Electronically Signed: By: Burman Nieves M.D. On: 08/11/2022 23:27   US OB LESS THAN 14 WEEKS WITH OB TRANSVAGINAL  Addendum Date: 08/11/2022   ADDENDUM REPORT: 08/11/2022 09:05 ADDENDUM: Study  discussed by telephone with PA SUSAN FISHER on 08/11/2022 at 0852 hours. She advises this patient did NOT undergo fertility treatments for this pregnancy, but still Heterotopic Pregnancy can rarely occur. Electronically Signed   By: Odessa Fleming M.D.   On: 08/11/2022 09:05   Result Date: 08/11/2022 CLINICAL DATA:  31 year old female with pain in the 1st trimester of pregnancy. Recent Ob ultrasound with no adverse features. EXAM: OBSTETRIC <14 WK [place] AND TRANSVAGINAL OB US TECHNIQUE: Both transabdominal and transvaginal ultrasound examinations were performed for complete evaluation of the gestation as well as the maternal uterus, adnexal regions, and pelvic cul-de-sac. Transvaginal technique was performed to assess early pregnancy. COMPARISON:  08/03/2022. FINDINGS: Intrauterine gestational sac: Single Yolk sac:  Visible Embryo:  Visible Cardiac Activity: Detected Heart Rate: 171 bpm CRL: 23.7 mm 9 w 1 d, concordant with the 08/03/2022 comparison [place] EDC: 03/15/2023 Subchorionic hemorrhage:  None visualized. Maternal uterus/adnexae: Small volume of free fluid in the cul-de-sac appears faily simple. The left ovary appears stable and within normal limits with probable corpus luteum (image 40). The left ovary is 4.3 x 2.4 by 2.1 cm. But the right adnexa region is now abnormal, with a large up to 9 cm complex echogenic and nonvascular area adjacent to the ovary (series 1, image 27) suspicious for hematoma. The ovarian parenchyma is suspected on series 1, image 36 and demonstrates small follicles and preserved Doppler vascularity. The right ovary there is roughly 3-4 cm and contiguous with the complex echogenic area. IMPRESSION: 1. New large roughly 9 cm complex fluid collection at the right adnexa inseparable from the right ovary. But there is maintained Doppler vascularity within the ovary which also demonstrates several small follicles. This is indeterminate, possibly hemorrhage/hematoma. Consider rare Heterotopic  Pregnancy. Recommend OB GYN consultation. 2. Stable, viable IUP with appropriate growth since 08/03/2022, estimated gestational age now 9 weeks and 1 day. 3. Left ovary remains normal. Electronically Signed: By: Odessa Fleming M.D. On: 08/11/2022 08:47   US OB Comp Less 14 Wks  Result Date: 08/03/2022 CLINICAL DATA:  Abdominal pain. EXAM: OBSTETRIC <14 WK ULTRASOUND TECHNIQUE: Transabdominal ultrasound was performed for evaluation of the gestation as well as the maternal uterus and adnexal regions. COMPARISON:  None Available. FINDINGS: Intrauterine gestational sac: Single Yolk sac:  Visualized. Embryo:  Visualized. Cardiac Activity: Visualized. Heart Rate: 166 bpm CRL:   15.8 mm   8 w 0 d                  [place] EDC: Mar 15, 2023. Subchorionic hemorrhage:  None visualized. Maternal uterus/adnexae: Right ovary is unremarkable. Probable corpus luteum cyst seen in left ovary. No free fluid is noted. IMPRESSION: Single live intrauterine gestation of 8 weeks 0 days. Electronically Signed   By: Lupita Raider M.D.   On: 08/03/2022 09:15   US Abdomen Limited  RUQ (LIVER/GB)  Result Date: 08/03/2022 CLINICAL DATA:  Right quadrant pain EXAM: ULTRASOUND ABDOMEN LIMITED RIGHT UPPER QUADRANT COMPARISON:  None Available. FINDINGS: Gallbladder: No gallstones or wall thickening visualized. No sonographic Murphy sign noted by sonographer. Common bile duct: Diameter: 2 mm Liver: No focal lesion identified. Within normal limits in parenchymal echogenicity. Portal vein is patent on color Doppler imaging with normal direction of blood flow towards the liver. Other: None. IMPRESSION: Normal upper quadrant ultrasound. Electronically Signed   By: Allegra Lai M.D.   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 Tonna Boehringer.  MRI results reviewed independently and reported as above with 8.2 cm right lower quadrant lesion.  Off  note reviewed adjusting intraluminal mass.  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  Chromogranin A levels are pending at time of dictation.  CEA and CA125 have been drawn for completeness.  Patient may benefit from octreotide scan, but unclear if able to complete during pregnancy.  Unlikely able to do a Dotatate PET scan.  Patient may require colonoscopy to further define her anatomy and determine whether mass is truly small intestine or at the ileocecal valve.  Ultimately, patient may require surgical resection. Hyperemesis gravidarum: Significantly improved.  Patient now tolerating liquid diet. Diarrhea: Patient has had no episodes this hospitalization.  Unclear if related to mass noted on MRI and laparoscopy.  Appreciate consult, will follow.    Jeralyn Ruths, MD   08/12/2022 8:40 PM

## 2022-08-13 MED ORDER — MENTHOL 3 MG MT LOZG: 1.0000 | LOZENGE | OROMUCOSAL | 12 refills | Status: DC | PRN

## 2022-08-13 MED ORDER — SIMETHICONE 80 MG PO CHEW: 80.0000 mg | CHEWABLE_TABLET | Freq: Four times a day (QID) | ORAL | 0 refills | Status: DC | PRN

## 2022-08-13 MED ORDER — DOCUSATE SODIUM 100 MG PO CAPS: 100.0000 mg | ORAL_CAPSULE | Freq: Two times a day (BID) | ORAL | 0 refills | Status: DC

## 2022-08-13 MED ORDER — ONDANSETRON HCL 4 MG PO TABS: 4.0000 mg | ORAL_TABLET | Freq: Four times a day (QID) | ORAL | 0 refills | Status: DC | PRN

## 2022-08-13 MED ORDER — HYDROCODONE-ACETAMINOPHEN 5-325 MG PO TABS: 1.0000 | ORAL_TABLET | ORAL | 0 refills | Status: AC | PRN

## 2022-08-13 MED ORDER — DOXYLAMINE SUCCINATE (SLEEP) 25 MG PO TABS: 25.0000 mg | ORAL_TABLET | Freq: Two times a day (BID) | ORAL | 0 refills | Status: DC

## 2022-08-13 MED ORDER — FAMOTIDINE 20 MG PO TABS: 20.0000 mg | ORAL_TABLET | Freq: Two times a day (BID) | ORAL | 0 refills | Status: DC

## 2022-08-13 MED ORDER — PYRIDOXINE HCL 25 MG PO TABS: 25.0000 mg | ORAL_TABLET | Freq: Two times a day (BID) | ORAL | 0 refills | Status: AC

## 2022-08-13 NOTE — Progress Notes (Signed)
D/C home via car   Verbalized understanding of d/c instructions

## 2022-08-13 NOTE — Discharge Summary (Signed)
GynecologicalDischarge Summary  Patient Name: Carol Frye DOB: 1990-11-22 MRN: [id number]  Date of Admission: 08/11/2022 Date of Discharge: 08/13/2022  Hospital course:  The pt is G2P1001 at [redacted]w[redacted]d and was admitted for Hyperemesis, she also had new onset of RLQ pain and was found to have a right adnexal/pelvic mass. She was evaluated by Dr Jean Rosenthal and taken to surgery, where she was noted to have RLQ mass involving the terminal ileum. General Surgery, GI and Oncology were consulted, labwork was completed, as differential dx includes carcinoid tumor.   Pain and nausea were managed with IV and PO medications, IV access was lost on POD2, and pt was tolerating diet advancement and PO meds.   Patient was felt to be stable for discharge on postoperative day number 2  when she was tolerating a regular diet, pain was controlled with po pain medications, and she was ambulating and voiding without difficulty. Vital signs were stable and physical exam remained benign throughout her hospital stay. Incision was clean,dry, and intact with medical glue.  She will follow up per below for post-op check and New OB visit  Rx given for Vicodin, Zofran, and Colace.    She was given specific instructions and numbers to call in written and verbal format. She verbalized understanding, agrees with the plan of care, and all questions answered to her satisfaction.  Discharge Physical Exam:  BP 120/62 (BP Location: Right Arm)   Pulse 76   Temp 98 F (36.7 C) (Oral)   Resp 20   Ht 5\' 9"  (1.753 m)   Wt 94.6 kg   LMP 06/08/2022   SpO2 99%   Breastfeeding Unknown   BMI 30.80 kg/m   General: NAD CV: RRR Pulm: CTABL, nl effort ABD: s/nd/nt Incision: c/d/i  DVT Evaluation: LE non-ttp, no evidence of DVT on exam.  Hemoglobin  Date Value Ref Range Status  08/12/2022 10.6 (L) 12.0 - 15.0 g/dL Final   HCT  Date Value Ref Range Status  08/12/2022 31.3 (L) 36.0 - 46.0 % Final      Plan:  Carol Frye was discharged  to home in good condition. Follow-up appointment at [facility] in 2 weeks   Discharge Medications: Allergies as of 08/13/2022       Reactions   Sulfa Antibiotics    Throat swelling   Mushroom Extract Complex Rash        Medication List     STOP taking these medications    ondansetron 4 MG disintegrating tablet Commonly known as: ZOFRAN-ODT       TAKE these medications    acetaminophen 500 MG tablet Commonly known as: TYLENOL Take 500 mg by mouth every 6 (six) hours as needed for mild pain, moderate pain, headache or fever.   docusate sodium 100 MG capsule Commonly known as: COLACE Take 1 capsule (100 mg total) by mouth 2 (two) times daily.   doxylamine (Sleep) 25 MG tablet Commonly known as: UNISOM Take 1 tablet (25 mg total) by mouth 2 (two) times daily.   famotidine 20 MG tablet Commonly known as: PEPCID Take 1 tablet (20 mg total) by mouth every 12 (twelve) hours.   HYDROcodone-acetaminophen 5-325 MG tablet Commonly known as: NORCO/VICODIN Take 1 tablet by mouth every 4 (four) hours as needed for up to 5 days for moderate pain or severe pain (1 tab if pain <7; 2 tabs if pain 7 or greater).   menthol-cetylpyridinium 3 MG lozenge Commonly known as: CEPACOL Take 1 lozenge (3 mg total) by  mouth every 2 (two) hours as needed for sore throat.   ondansetron 4 MG tablet Commonly known as: ZOFRAN Take 1 tablet (4 mg total) by mouth every 6 (six) hours as needed for nausea.   prenatal multivitamin Tabs tablet Take 1 tablet by mouth daily at 12 noon.   promethazine 12.5 MG tablet Commonly known as: PHENERGAN Take 1 tablet (12.5 mg total) by mouth every 4 (four) hours as needed for nausea or vomiting.   pyridOXINE 25 MG tablet Commonly known as: VITAMIN B6 Take 1 tablet (25 mg total) by mouth 2 (two) times daily.   simethicone 80 MG chewable tablet Commonly known as: MYLICON Chew 1 tablet (80 mg total) by mouth 4 (four) times daily as needed for  flatulence.         Follow-up Information     Chester, Isami, DO Follow up.   Specialty: Surgery Contact information: [place] [address] [phone]         [facility] Follow up in 2 week(s).   Why: New OB and postop appt as scheduled on 08/30/22 Contact information: [place]        Geanie Kenning, PA-C Follow up.   Specialty: Gastroenterology Why: [facility] Contact information: [place] [address] [phone]                 Signed: Francetta Found, CNM 3:18 PM

## 2022-08-13 NOTE — Progress Notes (Signed)
ANTEPARTUM PROGRESS NOTE  Carol Frye is a 31 y.o. G2P1001 at [redacted]w[redacted]d with [place] of Estimated Date of Delivery: 03/15/23 who is admitted for RLQ pain and hyperemesis.   Length of Stay:  2 Days. Admitted 08/11/2022  Subjective: Feels really pretty good this morning other than concern about the mass in her pelvis. Nausea and pain are well controlled.   She reports no bleeding and no loss of fluid per vagina.  Vitals:  BP 114/62 (BP Location: Left Arm)   Pulse 80   Temp 98.3 F (36.8 C) (Oral)   Resp 20   Ht 5\' 9"  (1.753 m)   Wt 94.6 kg   LMP 06/08/2022   SpO2 99%   Breastfeeding Unknown   BMI 30.80 kg/m   Physical Examination: CONSTITUTIONAL: Well-developed, well-nourished female in no acute distress.  HENT:  Normocephalic, atraumatic SKIN: Skin is warm and dry. [place]: Alert and oriented to person, place, and time PSYCHIATRIC: Normal mood and affect.  CARDIOVASCULAR: Normal heart rate noted, regular rhythm RESPIRATORY: Effort and breath sounds normal MUSCULOSKELETAL: Normal range of motion.  ABDOMEN: Soft, nondistended; Abd incisions with medical glue intact.  Pelvic: deferred    Results for orders placed or performed during the hospital encounter of 08/11/22 (from the past 48 hour(s))  Potassium     Status: Abnormal   Collection Time: 08/11/22  2:08 PM  Result Value Ref Range   Potassium 3.0 (L) 3.5 - 5.1 mmol/L    Comment: Performed at [facility], [address] [place], [place] [address]  CBC     Status: Abnormal   Collection Time: 08/12/22  6:33 AM  Result Value Ref Range   WBC 9.8 4.0 - 10.5 K/uL   RBC 3.78 (L) 3.87 - 5.11 MIL/uL   Hemoglobin 10.6 (L) 12.0 - 15.0 g/dL   HCT 31.3 (L) 36.0 - 46.0 %   MCV 82.8 80.0 - 100.0 fL   MCH 28.0 26.0 - 34.0 pg   MCHC 33.9 30.0 - 36.0 g/dL   RDW 11.8 11.5 - 15.5 %   Platelets 278 150 - 400 K/uL   nRBC 0.0 0.0 - 0.2 %    Comment: Performed at [facility], [address] [place], [place] [address]   Comprehensive metabolic panel     Status: Abnormal   Collection Time: 08/12/22  6:33 AM  Result Value Ref Range   Sodium 138 135 - 145 mmol/L   Potassium 3.7 3.5 - 5.1 mmol/L   Chloride 113 (H) 98 - 111 mmol/L   CO2 21 (L) 22 - 32 mmol/L   Glucose, Bld 158 (H) 70 - 99 mg/dL    Comment: Glucose reference range applies only to samples taken after fasting for at least 8 hours.   BUN 5 (L) 6 - 20 mg/dL   Creatinine, Ser 0.42 (L) 0.44 - 1.00 mg/dL   Calcium 8.0 (L) 8.9 - 10.3 mg/dL   Total Protein 6.1 (L) 6.5 - 8.1 g/dL   Albumin 2.6 (L) 3.5 - 5.0 g/dL   AST 12 (L) 15 - 41 U/L   ALT 17 0 - 44 U/L   Alkaline Phosphatase 77 38 - 126 U/L   Total Bilirubin 0.4 0.3 - 1.2 mg/dL   GFR, Estimated >60 >60 mL/min    Comment: (NOTE) Calculated using the CKD-EPI Creatinine Equation (2021)    Anion gap 4 (L) 5 - 15    Comment: Performed at [facility], [address] [place], [place] [address]  Current scheduled medications  docusate sodium  100 mg Oral BID   pyridOXINE  25 mg Oral BID   And   doxylamine (Sleep)  25 mg Oral BID   famotidine  20 mg Oral Q12H    I have reviewed the patient's current medications.  ASSESSMENT: Patient Active Problem List   Diagnosis Date Noted   Hyperemesis gravidarum to [redacted] weeks gestation, electrolyte imbalance 08/11/2022   Colonic mass 08/11/2022   Abdominal pain, chronic, right lower quadrant 08/11/2022   Right lower quadrant abdominal mass 08/11/2022   Indication for care in labor or delivery 05/23/2021   Uterine contractions 05/02/2021   Amniotic fluid leaking 04/30/2021   Pregnancy 04/30/2021    PLAN: Gen Surgery consult for further eval of RLQ mass- labs pending, plan outpatient mgmt for same.   Pt reports her nausea is well controlled and pain is controlled. Stable from OB standpoint/HG. Plan to advance diet to soft/bland with PO meds as IV access has been lost.    Randa Ngo, CNM 08/13/2022  9:38 AM

## 2022-08-15 LAB — CHROMOGRANIN A: Chromogranin A (ng/mL): 24.4 ng/mL (ref 0.0–101.8)

## 2022-08-16 LAB — CA 19-9 (SERIAL): CA 19-9: 4 U/mL (ref 0–35)

## 2022-08-16 LAB — CEA: CEA: <0.6 ng/mL (ref 0.0–4.7)

## 2022-08-24 IMAGING — CT CT RENAL STONE PROTOCOL
2 of 4 series · 17 of 46 positions shown, 19 images · non-contrast
Comparison: None.

CLINICAL DATA: Flank pain.  RLQ pain

EXAM:
CT ABDOMEN AND PELVIS WITHOUT CONTRAST
TECHNIQUE: Multidetector CT imaging of the abdomen and pelvis was performed
following the standard protocol without IV contrast.

[Series 2: stone full standard · axial · 0.76mm/px · z∈[+210,+654]mm · 14 of 99 slices shown, 16 images]
[im 5/99  soft-tissue]
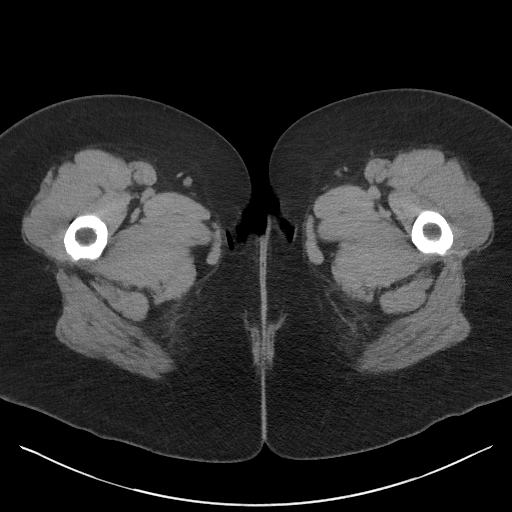
[im 5/99  bone]
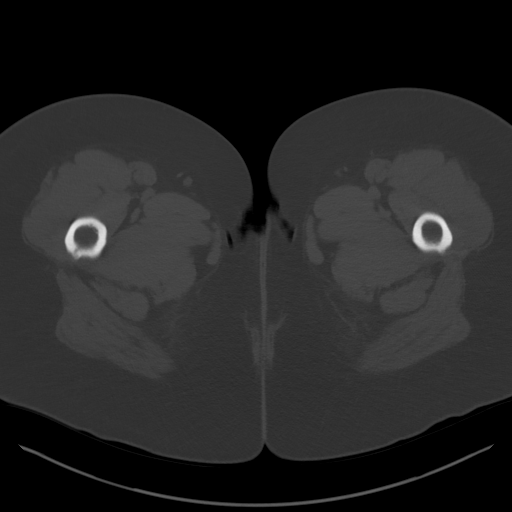
[im 13/99  soft-tissue]
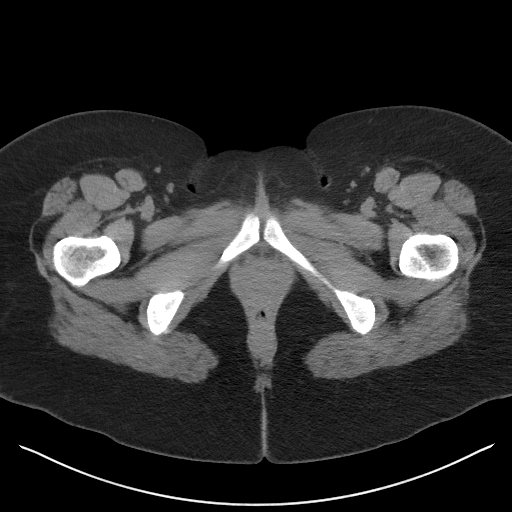
[im 18/99  soft-tissue]
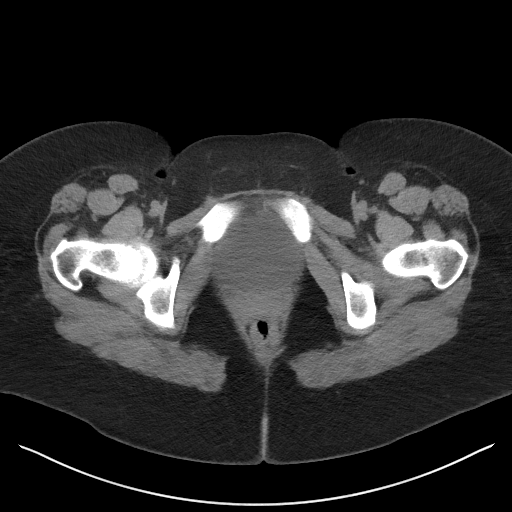
[im 26/99  soft-tissue]
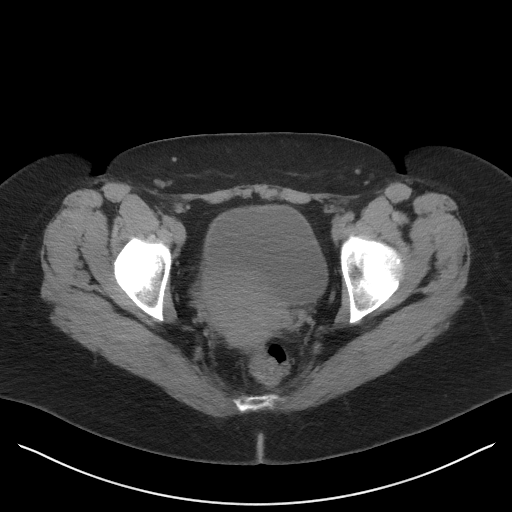
[im 35/99  soft-tissue]
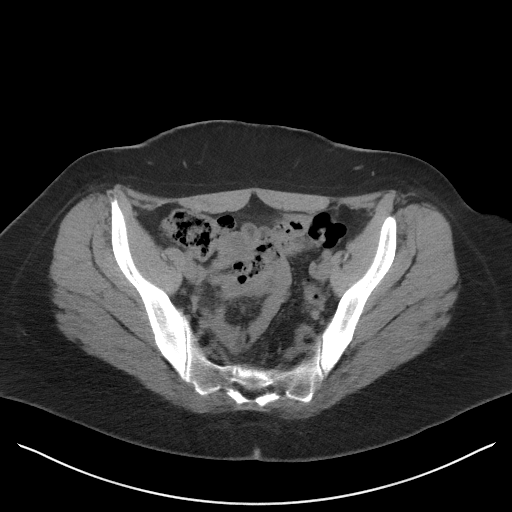
[im 39/99  soft-tissue]
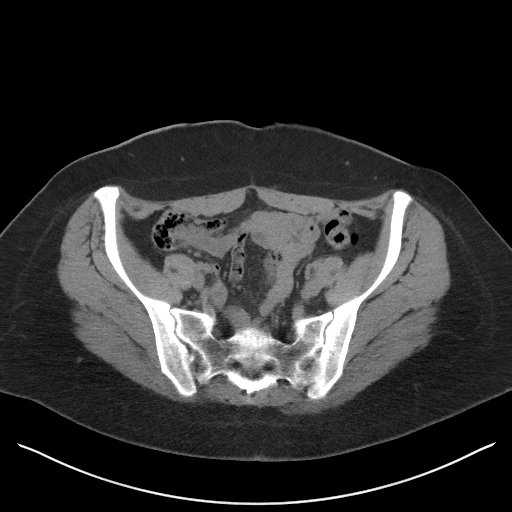
[im 47/99  soft-tissue]
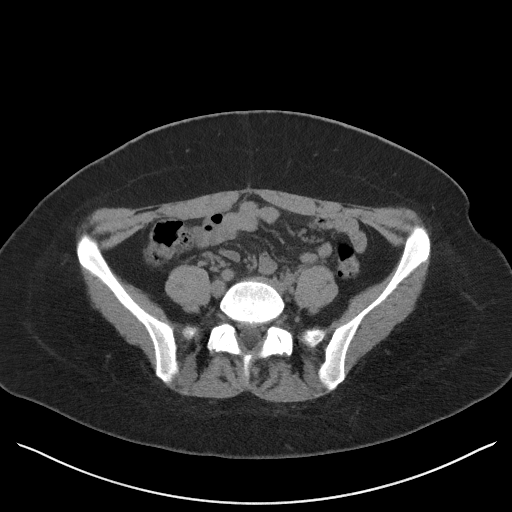
[im 52/99  soft-tissue]
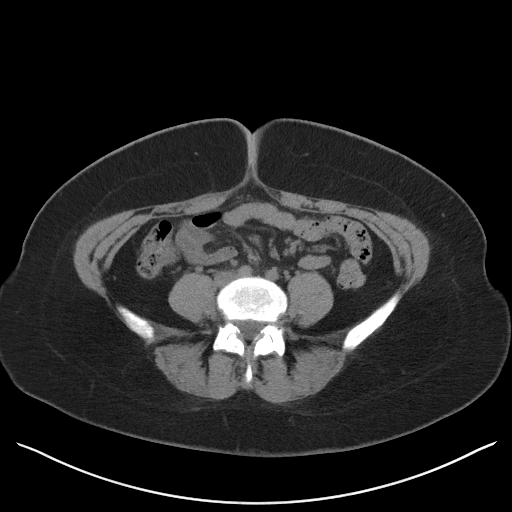
[im 60/99  soft-tissue]
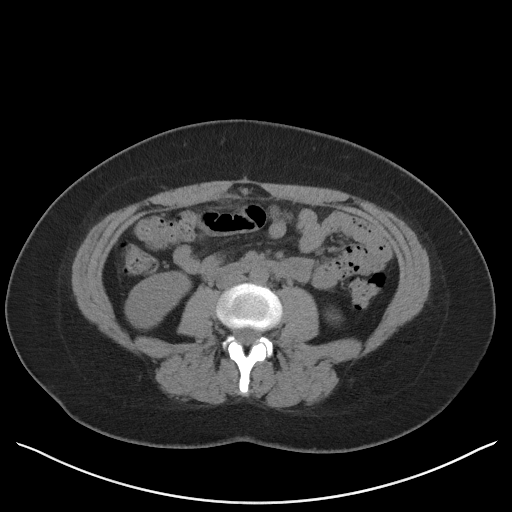
[im 60/99  bone]
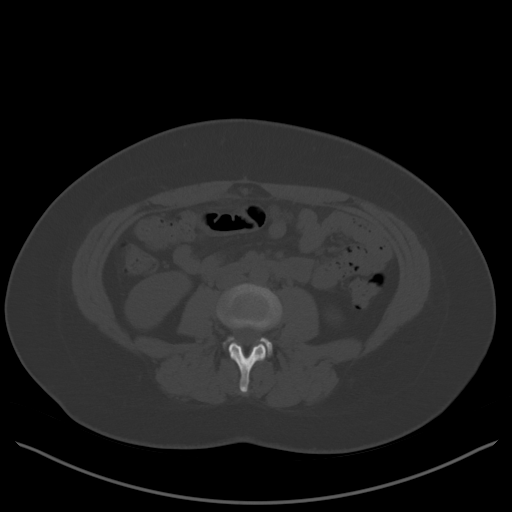
[im 64/99  soft-tissue]
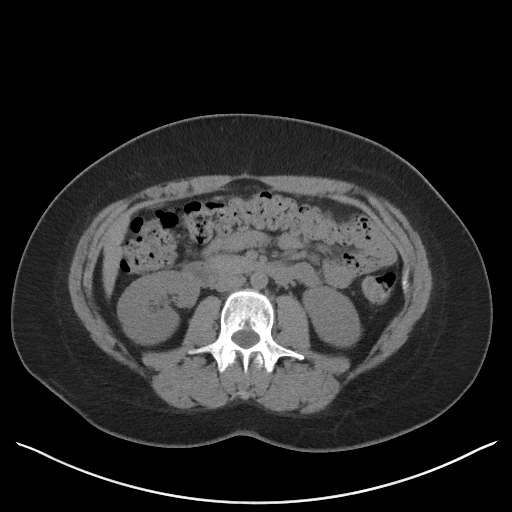
[im 73/99  soft-tissue]
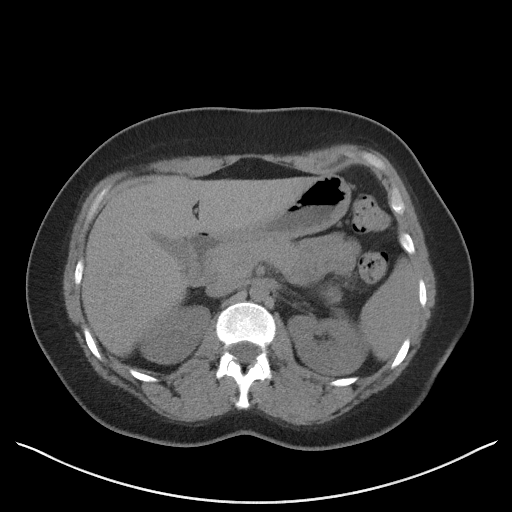
[im 81/99  soft-tissue]
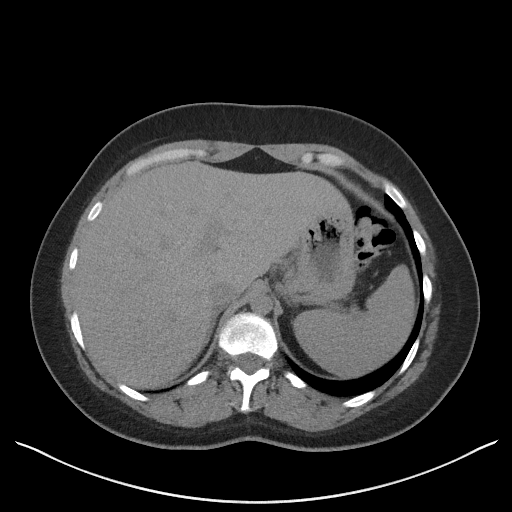
[im 86/99  soft-tissue]
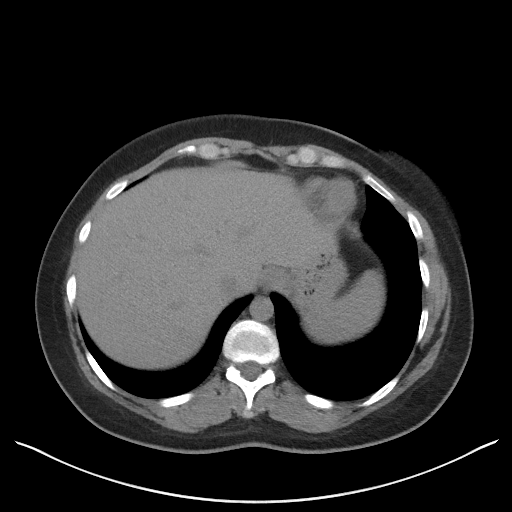
[im 94/99  soft-tissue]
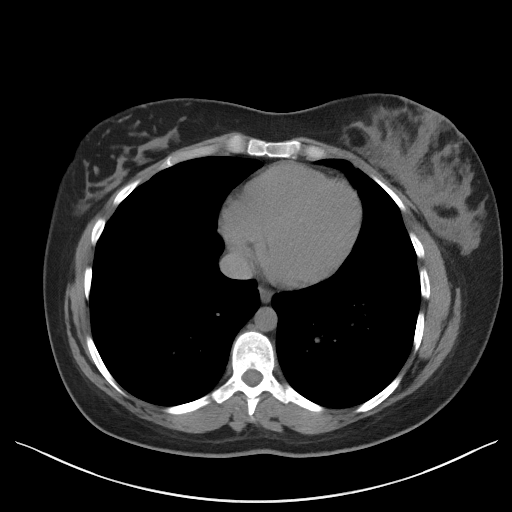

[Series 5: coronal · coronal · 0.96mm/px · 3 of 140 slices shown]
[im 47/140  soft-tissue]
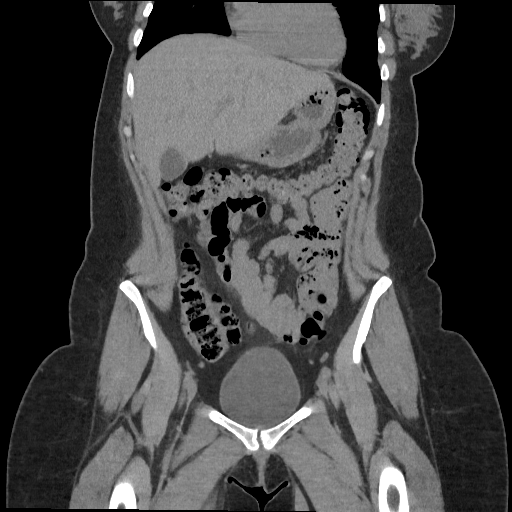
[im 62/140  soft-tissue]
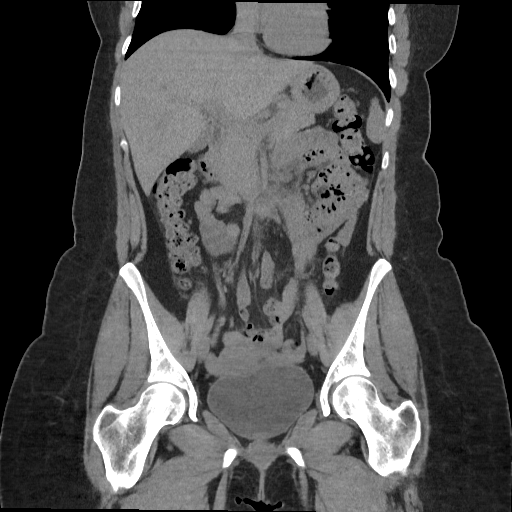
[im 78/140  soft-tissue]
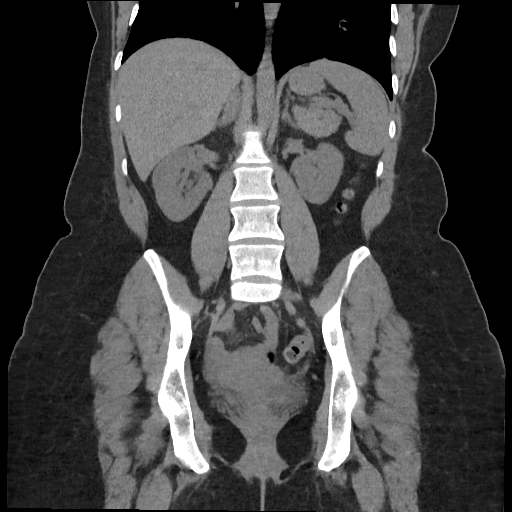

[17 of 46 positions shown; findings below may reference images not displayed]

FINDINGS: Lower chest: No acute abnormality.

Hepatobiliary: No focal liver abnormality. No gallstones,
gallbladder wall thickening, or pericholecystic fluid. No biliary
dilatation.

Pancreas: No focal lesion. Normal pancreatic contour. No surrounding
inflammatory changes. No main pancreatic ductal dilatation.

Spleen: Normal in size without focal abnormality.

Adrenals/Urinary Tract:

No adrenal nodule bilaterally.

No nephrolithiasis and no hydronephrosis. No definite
contour-deforming renal mass.

No ureterolithiasis or hydroureter.

The urinary bladder is unremarkable.

Stomach/Bowel: Stomach is within normal limits. No evidence of bowel
wall thickening or dilatation. The appendix is normal in caliber. No
surrounding inflammatory changes are noted.

Vascular/Lymphatic: No abdominal aorta or iliac aneurysm. Multiple
prominent but nonenlarged mesenteric lymph nodes. No abdominal,
pelvic, or inguinal lymphadenopathy.

Reproductive: Uterus and bilateral adnexa are unremarkable.

Other: Trace free fluid within the pelvis which may be physiologic.
No intraperitoneal free gas. No organized fluid collection.

Musculoskeletal:

No abdominal wall hernia or abnormality.

No suspicious lytic or blastic osseous lesions. Densely sclerotic
lesion within the right ischial tuberosity likely bone island. No
acute displaced fracture.
IMPRESSION: 1. No acute intra-abdominal or intrapelvic abnormality with limited
evaluation on this noncontrast study.
2. No nephroureterolithiasis bilaterally.
3. Normal appendix.

## 2022-08-28 DIAGNOSIS — Z3483 Encounter for supervision of other normal pregnancy, third trimester: Secondary | ICD-10-CM | POA: Insufficient documentation

## 2022-08-30 LAB — OB RESULTS CONSOLE RUBELLA ANTIBODY, IGM: Rubella: IMMUNE

## 2022-08-30 LAB — OB RESULTS CONSOLE VARICELLA ZOSTER ANTIBODY, IGG: Varicella: IMMUNE

## 2022-08-30 LAB — OB RESULTS CONSOLE HEPATITIS B SURFACE ANTIGEN: Hepatitis B Surface Ag: NEGATIVE

## 2022-10-30 NOTE — L&D Delivery Note (Signed)
Delivery Note  Bradlee Arakelian is a G2P1001 at [redacted]w[redacted]d with an LMP of 06/08/22, consistent with [place] at [redacted]w[redacted]d.   First Stage: Labor onset: 0100 Analgesia /Anesthesia intrapartum: epidural SROM at 0636 GBS: negative IP Antibiotics: none  Second Stage: Complete dilation at 0600 Onset of pushing at 0644 FHR second stage 145 bpm with moderate, variable decels with pushing   Amberly presented to L&D in active labor She was 5.5/80/-2. She progressed  to C/C/+2 with a spontaneous urge to push.  She pushed  effectively over approximately 6 minutes for a spontaneous vaginal birth.  Delivery of a viable baby girl on 03/04/23 at 0650 by CNM Delivery of fetal head in OA position with restitution to ROT. no nuchal cord;  Anterior then posterior shoulders delivered easily with gentle downward traction. Baby placed on mom's chest, and attended to by baby RN Cord double clamped after cessation of pulsation, cut by FOB  Cord blood sample collection: Not Indicated A POS Collection of cord blood donation no Arterial cord blood sample  none  Third Stage: Oxytocin bolus started after delivery of infant for hemorrhage prophylaxis  Placenta delivered schultz intact with 3 VC @ [phone] Placenta disposition: discarded per protocol Uterine tone firm / bleeding scant  2nd degree perineal laceration identified  Anesthesia for repair: epidural, lidocaine 1% Repair 2-0 vicryl Est. Blood Loss (mL): 100  Complications: none  Mom to postpartum.  Baby to Couplet care / Skin to Skin.  Newborn: Information for the patient's newborn:  Katelyn, Krohn [[id number]]  Live born female "Theora Gianotti" Birth Weight:   APGAR: 8, 9  Newborn Delivery   Birth date/time: 03/04/2023 06:50:00 Delivery type: Vaginal, Spontaneous       Feeding planned: breast feeding  ---------- Chari Manning, CNM Certified Nurse Midwife [facility]

## 2023-02-13 ENCOUNTER — Observation Stay
Admission: EM | Admit: 2023-02-13 | Discharge: 2023-02-13 | Disposition: A | Discharge status: Home / Self Care | Payer: BC Managed Care – PPO | Attending: Obstetrics and Gynecology | Admitting: Obstetrics and Gynecology

## 2023-02-13 ENCOUNTER — Other Ambulatory Visit: Payer: Self-pay

## 2023-02-13 ENCOUNTER — Encounter: Payer: Self-pay | Admitting: Obstetrics and Gynecology

## 2023-02-13 DIAGNOSIS — O26899 Other specified pregnancy related conditions, unspecified trimester: Secondary | ICD-10-CM | POA: Diagnosis present

## 2023-02-13 DIAGNOSIS — Z3A35 35 weeks gestation of pregnancy: Secondary | ICD-10-CM | POA: Insufficient documentation

## 2023-02-13 DIAGNOSIS — O26893 Other specified pregnancy related conditions, third trimester: Principal | ICD-10-CM | POA: Insufficient documentation

## 2023-02-13 DIAGNOSIS — R109 Unspecified abdominal pain: Secondary | ICD-10-CM | POA: Insufficient documentation

## 2023-02-13 LAB — CBC
HCT: 32.9 % — ABNORMAL LOW (ref 36.0–46.0)
Hemoglobin: 10.8 g/dL — ABNORMAL LOW (ref 12.0–15.0)
MCH: 27.3 pg (ref 26.0–34.0)
MCHC: 32.8 g/dL (ref 30.0–36.0)
MCV: 83.3 fL (ref 80.0–100.0)
Platelets: 204 10*3/uL (ref 150–400)
RBC: 3.95 MIL/uL (ref 3.87–5.11)
RDW: 13.8 % (ref 11.5–15.5)
WBC: 10.5 10*3/uL (ref 4.0–10.5)
nRBC: 0 % (ref 0.0–0.2)

## 2023-02-13 LAB — URINALYSIS, COMPLETE (UACMP) WITH MICROSCOPIC
Bilirubin Urine: NEGATIVE
Glucose, UA: NEGATIVE mg/dL
Hgb urine dipstick: NEGATIVE
Ketones, ur: NEGATIVE mg/dL
Nitrite: NEGATIVE
Protein, ur: NEGATIVE mg/dL
Specific Gravity, Urine: 1.011 (ref 1.005–1.030)
pH: 6 (ref 5.0–8.0)

## 2023-02-13 LAB — WET PREP, GENITAL
Clue Cells Wet Prep HPF POC: NONE SEEN
Sperm: NONE SEEN
Trich, Wet Prep: NONE SEEN
WBC, Wet Prep HPF POC: <10 (ref <10)
Yeast Wet Prep HPF POC: NONE SEEN

## 2023-02-13 LAB — FETAL FIBRONECTIN: Fetal Fibronectin: NEGATIVE

## 2023-02-13 MED ORDER — LACTATED RINGERS IV SOLN
INTRAVENOUS | Status: DC
  Administered 2023-02-13: 1000 mL via INTRAVENOUS

## 2023-02-13 MED ORDER — ACETAMINOPHEN 325 MG PO TABS
650.0000 mg | ORAL_TABLET | ORAL | Status: DC | PRN
  Administered 2023-02-13 (×2): 325 mg via ORAL
  Filled 2023-02-13: qty 2

## 2023-02-13 MED ORDER — ONDANSETRON HCL 4 MG/2ML IJ SOLN
4.0000 mg | Freq: Four times a day (QID) | INTRAMUSCULAR | Status: DC | PRN
  Administered 2023-02-13: 4 mg via INTRAVENOUS
  Filled 2023-02-13: qty 2

## 2023-02-13 MED ORDER — LACTATED RINGERS IV BOLUS
1000.0000 mL | Freq: Once | INTRAVENOUS | Status: AC
  Administered 2023-02-13: 1000 mL via INTRAVENOUS

## 2023-02-13 MED ORDER — ACETAMINOPHEN 325 MG PO TABS
ORAL_TABLET | ORAL | Status: AC
  Filled 2023-02-13: qty 1

## 2023-02-13 NOTE — Progress Notes (Signed)
Pt presented to L/D triage with reported abdominal cramping and intermittent contractions that began a few days ago and have begun to increase in frequency and intensity today. Pt reports intermittent pain 8/10 with constant headache 2/10. Pt also reports transient vision changes today- no other PIH symptoms. She reports feeling "unwell"- nausea with several episodes of loose stools. Pt able to tolerate PO fluids well. No abdominal tenderness or urinary pain. + fetal movement, no bleeding or LOF.  Monitors applied and assessing. Initial FHT 160. VSS. CNM notified.

## 2023-02-13 NOTE — Discharge Summary (Signed)
Patient ID: Carol Frye MRN: [id number] DOB/AGE: 1991-09-23 31 y.o.  Admit date: 02/13/2023 Discharge date: 02/13/2023  Admission Diagnoses: 31yo G2P1 at [redacted]w[redacted]d presents with feeling unwell and cramping.  Discharge Diagnoses: Feeling better, RNST, not in labor  Factors complicating pregnancy: Family history of congenital heart defect Adnexal Mass   Prenatal Procedures: NST  Consults: None  Significant Diagnostic Studies:  Results for orders placed or performed during the hospital encounter of 02/13/23 (from the past 168 hour(s))  CBC   Collection Time: 02/13/23  5:30 PM  Result Value Ref Range   WBC 10.5 4.0 - 10.5 K/uL   RBC 3.95 3.87 - 5.11 MIL/uL   Hemoglobin 10.8 (L) 12.0 - 15.0 g/dL   HCT 40.9 (L) 81.1 - 91.4 %   MCV 83.3 80.0 - 100.0 fL   MCH 27.3 26.0 - 34.0 pg   MCHC 32.8 30.0 - 36.0 g/dL   RDW 78.2 95.6 - 21.3 %   Platelets 204 150 - 400 K/uL   nRBC 0.0 0.0 - 0.2 %  Wet prep, genital   Collection Time: 02/13/23  5:42 PM   Specimen: Urine, Clean Catch  Result Value Ref Range   Yeast Wet Prep HPF POC NONE SEEN NONE SEEN   Trich, Wet Prep NONE SEEN NONE SEEN   Clue Cells Wet Prep HPF POC NONE SEEN NONE SEEN   WBC, Wet Prep HPF POC <10 <10   Sperm NONE SEEN   Urinalysis, Complete w Microscopic -Urine, Clean Catch   Collection Time: 02/13/23  5:42 PM  Result Value Ref Range   Color, Urine STRAW (A) YELLOW   APPearance CLEAR (A) CLEAR   Specific Gravity, Urine 1.011 1.005 - 1.030   pH 6.0 5.0 - 8.0   Glucose, UA NEGATIVE NEGATIVE mg/dL   Hgb urine dipstick NEGATIVE NEGATIVE   Bilirubin Urine NEGATIVE NEGATIVE   Ketones, ur NEGATIVE NEGATIVE mg/dL   Protein, ur NEGATIVE NEGATIVE mg/dL   Nitrite NEGATIVE NEGATIVE   Leukocytes,Ua MODERATE (A) NEGATIVE   RBC / HPF 0-5 0 - 5 RBC/hpf   WBC, UA 0-5 0 - 5 WBC/hpf   Bacteria, UA FEW (A) NONE SEEN   Squamous Epithelial / HPF 0-5 0 - 5 /HPF   Mucus PRESENT   Fetal fibronectin   Collection Time: 02/13/23  5:45  PM  Result Value Ref Range   Fetal Fibronectin NEGATIVE NEGATIVE    Treatments: IV hydration  Hospital Course:  This is a 32 y.o. G2P1001 with IUP at 100w5d seen for cramping.  No leaking of fluid and no bleeding.  She was observed, fetal heart rate monitoring remained reassuring, and she had no signs/symptoms of preterm labor or other maternal-fetal concerns.  Pt reports she is "feeling better" and cramping has improved.  She was deemed stable for discharge to home with outpatient follow up.  Discharge Physical Exam:  BP 111/77 (BP Location: Left Arm)   Pulse 97   Temp 97.7 F (36.5 C) (Oral)   Resp 18   Ht  (1.753 m)   Wt 108 kg   LMP 06/08/2022   BMI 35.15 kg/m   General: NAD CV: RRR Pulm: nl effort ABD: s/nd/nt, gravid DVT Evaluation: LE non-ttp, no evidence of DVT on exam.  NST: FHR baseline: 130 bpm Variability: moderate Accelerations: yes Decelerations: none Category/reactivity: reactive   TOCO: occasional SVE: deferred      Discharge Condition: Stable  Disposition:  Discharge disposition: 01-Home or Self Care  Allergies as of 02/13/2023       Reactions   Sulfa Antibiotics    Throat swelling   Mushroom Extract Complex Rash        Medication List     STOP taking these medications    docusate sodium 100 MG capsule Commonly known as: COLACE   doxylamine (Sleep) 25 MG tablet Commonly known as: UNISOM   famotidine 20 MG tablet Commonly known as: PEPCID   menthol-cetylpyridinium 3 MG lozenge Commonly known as: CEPACOL   ondansetron 4 MG tablet Commonly known as: ZOFRAN   promethazine 12.5 MG tablet Commonly known as: PHENERGAN   simethicone 80 MG chewable tablet Commonly known as: MYLICON       TAKE these medications    acetaminophen 500 MG tablet Commonly known as: TYLENOL Take 500 mg by mouth every 6 (six) hours as needed for mild pain, moderate pain, headache or fever.   prenatal multivitamin Tabs  tablet Take 1 tablet by mouth daily at 12 noon.   pyridOXINE 25 MG tablet Commonly known as: VITAMIN B6 Take 1 tablet (25 mg total) by mouth 2 (two) times daily.         SignedQuillian Quince 02/13/2023 7:31 PM

## 2023-02-20 LAB — OB RESULTS CONSOLE GC/CHLAMYDIA
Chlamydia: NEGATIVE
Neisseria Gonorrhea: NEGATIVE

## 2023-02-20 LAB — OB RESULTS CONSOLE HIV ANTIBODY (ROUTINE TESTING): HIV: NONREACTIVE

## 2023-02-20 LAB — OB RESULTS CONSOLE GBS: GBS: NEGATIVE

## 2023-03-04 ENCOUNTER — Inpatient Hospital Stay: Payer: BC Managed Care – PPO | Admitting: Anesthesiology

## 2023-03-04 ENCOUNTER — Inpatient Hospital Stay
Admission: EM | Admit: 2023-03-04 | Discharge: 2023-03-05 | DRG: 807 | Disposition: A | Discharge status: Home / Self Care | Payer: BC Managed Care – PPO | Attending: Obstetrics | Admitting: Obstetrics

## 2023-03-04 ENCOUNTER — Other Ambulatory Visit: Payer: Self-pay

## 2023-03-04 ENCOUNTER — Encounter: Payer: Self-pay | Admitting: Obstetrics and Gynecology

## 2023-03-04 DIAGNOSIS — O99214 Obesity complicating childbirth: Principal | ICD-10-CM | POA: Diagnosis present

## 2023-03-04 DIAGNOSIS — Z8249 Family history of ischemic heart disease and other diseases of the circulatory system: Secondary | ICD-10-CM | POA: Diagnosis not present

## 2023-03-04 DIAGNOSIS — Z3A38 38 weeks gestation of pregnancy: Secondary | ICD-10-CM

## 2023-03-04 DIAGNOSIS — O26893 Other specified pregnancy related conditions, third trimester: Secondary | ICD-10-CM | POA: Diagnosis present

## 2023-03-04 LAB — CBC
HCT: 33.8 % — ABNORMAL LOW (ref 36.0–46.0)
Hemoglobin: 11.1 g/dL — ABNORMAL LOW (ref 12.0–15.0)
MCH: 26.7 pg (ref 26.0–34.0)
MCHC: 32.8 g/dL (ref 30.0–36.0)
MCV: 81.4 fL (ref 80.0–100.0)
Platelets: 200 10*3/uL (ref 150–400)
RBC: 4.15 MIL/uL (ref 3.87–5.11)
RDW: 13.9 % (ref 11.5–15.5)
WBC: 9.7 10*3/uL (ref 4.0–10.5)
nRBC: 0 % (ref 0.0–0.2)

## 2023-03-04 LAB — TYPE AND SCREEN
ABO/RH(D): A POS
Antibody Screen: NEGATIVE

## 2023-03-04 LAB — RPR: RPR Ser Ql: NONREACTIVE

## 2023-03-04 MED ORDER — MISOPROSTOL 200 MCG PO TABS
ORAL_TABLET | ORAL | Status: AC
  Filled 2023-03-04: qty 4

## 2023-03-04 MED ORDER — OXYTOCIN 10 UNIT/ML IJ SOLN
INTRAMUSCULAR | Status: AC
  Filled 2023-03-04: qty 2

## 2023-03-04 MED ORDER — LACTATED RINGERS IV SOLN
500.0000 mL | INTRAVENOUS | Status: DC | PRN
  Administered 2023-03-04: 500 mL via INTRAVENOUS

## 2023-03-04 MED ORDER — OXYCODONE HCL 5 MG PO TABS: 5.0000 mg | ORAL_TABLET | ORAL | Status: DC | PRN

## 2023-03-04 MED ORDER — FENTANYL-BUPIVACAINE-NACL 0.5-0.125-0.9 MG/250ML-% EP SOLN
EPIDURAL | Status: AC
  Filled 2023-03-04: qty 250

## 2023-03-04 MED ORDER — SODIUM CHLORIDE 0.9 % IV SOLN: 250.0000 mL | INTRAVENOUS | Status: DC | PRN

## 2023-03-04 MED ORDER — PHENYLEPHRINE 80 MCG/ML (10ML) SYRINGE FOR IV PUSH (FOR BLOOD PRESSURE SUPPORT): 80.0000 ug | PREFILLED_SYRINGE | INTRAVENOUS | Status: DC | PRN

## 2023-03-04 MED ORDER — SODIUM CHLORIDE 0.9% FLUSH
3.0000 mL | Freq: Two times a day (BID) | INTRAVENOUS | Status: DC
  Administered 2023-03-04: 3 mL via INTRAVENOUS

## 2023-03-04 MED ORDER — EPHEDRINE 5 MG/ML INJ: 10.0000 mg | INTRAVENOUS | Status: DC | PRN

## 2023-03-04 MED ORDER — ONDANSETRON HCL 4 MG PO TABS: 4.0000 mg | ORAL_TABLET | ORAL | Status: DC | PRN

## 2023-03-04 MED ORDER — FENTANYL CITRATE (PF) 100 MCG/2ML IJ SOLN
50.0000 ug | INTRAMUSCULAR | Status: DC | PRN
  Administered 2023-03-04: 100 ug via INTRAVENOUS
  Filled 2023-03-04: qty 2

## 2023-03-04 MED ORDER — ACETAMINOPHEN 325 MG PO TABS
650.0000 mg | ORAL_TABLET | ORAL | Status: DC | PRN
  Administered 2023-03-04: 650 mg via ORAL
  Filled 2023-03-04: qty 2

## 2023-03-04 MED ORDER — SODIUM CHLORIDE 0.9 % IV SOLN
INTRAVENOUS | Status: DC | PRN
  Administered 2023-03-04: 5 mL via EPIDURAL

## 2023-03-04 MED ORDER — LACTATED RINGERS IV SOLN: INTRAVENOUS | Status: DC

## 2023-03-04 MED ORDER — SOD CITRATE-CITRIC ACID 500-334 MG/5ML PO SOLN: 30.0000 mL | ORAL | Status: DC | PRN

## 2023-03-04 MED ORDER — LIDOCAINE HCL (PF) 1 % IJ SOLN
INTRAMUSCULAR | Status: DC | PRN
  Administered 2023-03-04: 3 mL via SUBCUTANEOUS

## 2023-03-04 MED ORDER — LIDOCAINE-EPINEPHRINE (PF) 1.5 %-1:200000 IJ SOLN
INTRAMUSCULAR | Status: DC | PRN
  Administered 2023-03-04: 2 mL via PERINEURAL
  Administered 2023-03-04: 3 mL via PERINEURAL

## 2023-03-04 MED ORDER — FLEET ENEMA 7-19 GM/118ML RE ENEM: 1.0000 | ENEMA | Freq: Every day | RECTAL | Status: DC | PRN

## 2023-03-04 MED ORDER — BENZOCAINE-MENTHOL 20-0.5 % EX AERO
1.0000 | INHALATION_SPRAY | CUTANEOUS | Status: DC | PRN
  Filled 2023-03-04 (×2): qty 56

## 2023-03-04 MED ORDER — SENNOSIDES-DOCUSATE SODIUM 8.6-50 MG PO TABS
2.0000 | ORAL_TABLET | ORAL | Status: DC
  Administered 2023-03-04 – 2023-03-05 (×2): 2 via ORAL
  Filled 2023-03-04 (×2): qty 2

## 2023-03-04 MED ORDER — OXYTOCIN BOLUS FROM INFUSION
333.0000 mL | Freq: Once | INTRAVENOUS | Status: AC
  Administered 2023-03-04: 333 mL via INTRAVENOUS

## 2023-03-04 MED ORDER — LACTATED RINGERS IV SOLN: 500.0000 mL | Freq: Once | INTRAVENOUS | Status: DC

## 2023-03-04 MED ORDER — MEASLES, MUMPS & RUBELLA VAC IJ SOLR
0.5000 mL | Freq: Once | INTRAMUSCULAR | Status: DC
  Filled 2023-03-04: qty 0.5

## 2023-03-04 MED ORDER — COCONUT OIL OIL
1.0000 | TOPICAL_OIL | Status: DC | PRN
  Filled 2023-03-04: qty 7.5

## 2023-03-04 MED ORDER — SIMETHICONE 80 MG PO CHEW: 80.0000 mg | CHEWABLE_TABLET | ORAL | Status: DC | PRN

## 2023-03-04 MED ORDER — SODIUM CHLORIDE 0.9% FLUSH: 3.0000 mL | INTRAVENOUS | Status: DC | PRN

## 2023-03-04 MED ORDER — FENTANYL-BUPIVACAINE-NACL 0.5-0.125-0.9 MG/250ML-% EP SOLN
12.0000 mL/h | EPIDURAL | Status: DC | PRN
  Administered 2023-03-04: 12 mL/h via EPIDURAL

## 2023-03-04 MED ORDER — BISACODYL 10 MG RE SUPP: 10.0000 mg | Freq: Every day | RECTAL | Status: DC | PRN

## 2023-03-04 MED ORDER — OXYTOCIN-SODIUM CHLORIDE 30-0.9 UT/500ML-% IV SOLN
2.5000 [IU]/h | INTRAVENOUS | Status: DC
  Filled 2023-03-04: qty 500

## 2023-03-04 MED ORDER — ZOLPIDEM TARTRATE 5 MG PO TABS: 5.0000 mg | ORAL_TABLET | Freq: Every evening | ORAL | Status: DC | PRN

## 2023-03-04 MED ORDER — ONDANSETRON HCL 4 MG/2ML IJ SOLN: 4.0000 mg | Freq: Four times a day (QID) | INTRAMUSCULAR | Status: DC | PRN

## 2023-03-04 MED ORDER — DIPHENHYDRAMINE HCL 50 MG/ML IJ SOLN: 12.5000 mg | INTRAMUSCULAR | Status: DC | PRN

## 2023-03-04 MED ORDER — TETANUS-DIPHTH-ACELL PERTUSSIS 5-2.5-18.5 LF-MCG/0.5 IM SUSY
0.5000 mL | PREFILLED_SYRINGE | Freq: Once | INTRAMUSCULAR | Status: DC
  Filled 2023-03-04: qty 0.5

## 2023-03-04 MED ORDER — ONDANSETRON HCL 4 MG/2ML IJ SOLN: 4.0000 mg | INTRAMUSCULAR | Status: DC | PRN

## 2023-03-04 MED ORDER — DIPHENHYDRAMINE HCL 25 MG PO CAPS: 25.0000 mg | ORAL_CAPSULE | Freq: Four times a day (QID) | ORAL | Status: DC | PRN

## 2023-03-04 MED ORDER — AMMONIA AROMATIC IN INHA
RESPIRATORY_TRACT | Status: AC
  Filled 2023-03-04: qty 10

## 2023-03-04 MED ORDER — LIDOCAINE HCL (PF) 1 % IJ SOLN
30.0000 mL | INTRAMUSCULAR | Status: DC | PRN
  Filled 2023-03-04: qty 30

## 2023-03-04 MED ORDER — WITCH HAZEL-GLYCERIN EX PADS
1.0000 | MEDICATED_PAD | CUTANEOUS | Status: DC | PRN
  Filled 2023-03-04 (×2): qty 100

## 2023-03-04 MED ORDER — IBUPROFEN 600 MG PO TABS
600.0000 mg | ORAL_TABLET | Freq: Four times a day (QID) | ORAL | Status: DC
  Administered 2023-03-04 – 2023-03-05 (×5): 600 mg via ORAL
  Filled 2023-03-04 (×5): qty 1

## 2023-03-04 MED ORDER — DIBUCAINE (PERIANAL) 1 % EX OINT
1.0000 | TOPICAL_OINTMENT | CUTANEOUS | Status: DC | PRN
  Filled 2023-03-04 (×3): qty 28

## 2023-03-04 MED ORDER — ACETAMINOPHEN 325 MG PO TABS: 650.0000 mg | ORAL_TABLET | ORAL | Status: DC | PRN

## 2023-03-04 MED ORDER — PRENATAL MULTIVITAMIN CH
1.0000 | ORAL_TABLET | Freq: Every day | ORAL | Status: DC
  Administered 2023-03-04 – 2023-03-05 (×2): 1 via ORAL
  Filled 2023-03-04 (×2): qty 1

## 2023-03-04 NOTE — Anesthesia Procedure Notes (Signed)
Epidural Patient location during procedure: OB Start time: 03/04/2023 6:31 AM End time: 03/04/2023 6:34 AM  Staffing Anesthesiologist: Lenard Simmer, MD Performed: anesthesiologist   Preanesthetic Checklist Completed: patient identified, IV checked, site marked, risks and benefits discussed, surgical consent, monitors and equipment checked, pre-op evaluation and timeout performed  Epidural Patient position: sitting Prep: ChloraPrep Patient monitoring: heart rate, continuous pulse ox and blood pressure Approach: midline Location: L3-L4 Injection technique: LOR saline  Needle:  Needle type: Tuohy  Needle gauge: 17 G Needle length: 9 cm Needle insertion depth: 6 cm Catheter type: closed end flexible Catheter size: 19 Gauge Catheter at skin depth: 11 cm Test dose: negative and 1.5% lidocaine with Epi 1:200 K  Assessment Sensory level: T10 Events: blood not aspirated, no cerebrospinal fluid, injection not painful, no injection resistance, no paresthesia and negative IV test  Additional Notes 1st attempt Pt. Evaluated and documentation done after procedure finished. Patient identified. Risks/Benefits/Options discussed with patient including but not limited to bleeding, infection, nerve damage, paralysis, failed block, incomplete pain control, headache, blood pressure changes, nausea, vomiting, reactions to medication both or allergic, itching and postpartum back pain. Confirmed with bedside nurse the patient's most recent platelet count. Confirmed with patient that they are not currently taking any anticoagulation, have any bleeding history or any family history of bleeding disorders. Patient expressed understanding and wished to proceed. All questions were answered. Sterile technique was used throughout the entire procedure. Please see nursing notes for vital signs. Test dose was given through epidural catheter and negative prior to continuing to dose epidural or start infusion. Warning  signs of high block given to the patient including shortness of breath, tingling/numbness in hands, complete motor block, or any concerning symptoms with instructions to call for help. Patient was given instructions on fall risk and not to get out of bed. All questions and concerns addressed with instructions to call with any issues or inadequate analgesia.    Patient tolerated the insertion well without immediate complications.Reason for block:procedure for pain

## 2023-03-04 NOTE — Anesthesia Preprocedure Evaluation (Signed)
Anesthesia Evaluation  Patient identified by MRN, date of birth, ID band Patient awake    Reviewed: Allergy & Precautions, NPO status , Patient's Chart, lab work & pertinent test results  History of Anesthesia Complications Negative for: history of anesthetic complications  Airway Mallampati: II  TM Distance: >3 FB Neck ROM: Full    Dental no notable dental hx. (+) Teeth Intact   Pulmonary neg pulmonary ROS   Pulmonary exam normal breath sounds clear to auscultation       Cardiovascular Exercise Tolerance: Good negative cardio ROS Normal cardiovascular exam Rhythm:Regular Rate:Normal     Neuro/Psych negative neurological ROS  negative psych ROS   GI/Hepatic Neg liver ROS,GERD  ,,  Endo/Other  negative endocrine ROS    Renal/GU   negative genitourinary   Musculoskeletal negative musculoskeletal ROS (+)    Abdominal  (+) + obese  Peds negative pediatric ROS (+)  Hematology negative hematology ROS (+)   Anesthesia Other Findings Past Surgical History: No date: NO PAST SURGERIES No date: OTHER SURGICAL HISTORY     Comment:  patient was put to sleep for suture repair of face after              dog attack at age of 7 years  BMI    Body Mass Index: 29.53 kg/m      Reproductive/Obstetrics (+) Pregnancy                             Anesthesia Physical Anesthesia Plan  ASA: 2  Anesthesia Plan: Epidural   Post-op Pain Management:    Induction:   PONV Risk Score and Plan:   Airway Management Planned:   Additional Equipment:   Intra-op Plan:   Post-operative Plan:   Informed Consent: I have reviewed the patients History and Physical, chart, labs and discussed the procedure including the risks, benefits and alternatives for the proposed anesthesia with the patient or authorized representative who has indicated his/her understanding and acceptance.     Dental Advisory  Given  Plan Discussed with: CRNA and Surgeon  Anesthesia Plan Comments:         Anesthesia Quick Evaluation

## 2023-03-04 NOTE — H&P (Signed)
OB History & Physical   History of Present Illness:   Chief Complaint: uterine contractions  HPI:  Carol Frye is a 32 y.o. G43P1001 female at [redacted]w[redacted]d, Patient's last menstrual period was 06/08/2022., consistent with [place] at [redacted]w[redacted]d, with Estimated Date of Delivery: 03/15/23.  She presents to L&D for normal labor and delivery  Reports active fetal movement  Contractions: every 2 to 5 minutes LOF/SROM: SROM clear Vaginal bleeding: none  Factors complicating pregnancy:  Obesity Family history of congenital heart defect Adnexal Mass  MFM appt 11/03/22 MFM recommendations against surgical management of this mass in pregnancy, which is in concordance with [facility] recommendations, as long as it remains stable and continues to seem benign  Last MRI 11/01/22 Mass/collection in the right lower quadrant of the abdomen has resolved. There is much less inflammation. Recommends repeat an MRI after delivery.  Elevated 1 hr gtt, normal 3 hour  Patient Active Problem List   Diagnosis Date Noted   Normal labor and delivery 03/04/2023   Abdominal cramping affecting pregnancy 02/13/2023   Hyperemesis gravidarum to [redacted] weeks gestation, electrolyte imbalance 08/11/2022   Colonic mass 08/11/2022   Abdominal pain, chronic, right lower quadrant 08/11/2022   Right lower quadrant abdominal mass 08/11/2022   Indication for care in labor or delivery 05/23/2021   Uterine contractions 05/02/2021   Amniotic fluid leaking 04/30/2021   Pregnancy 04/30/2021    Prenatal Transfer Tool  Maternal Diabetes: No Genetic Screening: Normal Maternal Ultrasounds/Referrals: Normal Fetal Ultrasounds or other Referrals:  None Maternal Substance Abuse:  No Significant Maternal Medications:  None Significant Maternal Lab Results: Group B Strep negative  Maternal Medical History:  History reviewed. No pertinent past medical history.  Past Surgical History:  Procedure Laterality Date   NO PAST SURGERIES     OTHER SURGICAL  HISTORY     patient was put to sleep for suture repair of face after dog attack at age of 7 years   ROBOTIC ASSISTED LAPAROSCOPIC OVARIAN CYSTECTOMY N/A 08/11/2022   Procedure: XI ROBOTIC ASSISTED DIAGNOSTIC LAPAROSCOPY;  Surgeon: Conard Novak, MD;  Location: [facility] ORS;  Service: Gynecology;  Laterality: N/A;    Allergies  Allergen Reactions   Sulfa Antibiotics     Throat swelling    Mushroom Extract Complex Rash    Prior to Admission medications   Medication Sig Start Date End Date Taking? Authorizing Provider  acetaminophen (TYLENOL) 500 MG tablet Take 500 mg by mouth every 6 (six) hours as needed for mild pain, moderate pain, headache or fever.    [provider]  Prenatal Vit-Fe Fumarate-FA (PRENATAL MULTIVITAMIN) TABS tablet Take 1 tablet by mouth daily at 12 noon.    [provider]  pyridOXINE (VITAMIN B6) 25 MG tablet Take 1 tablet (25 mg total) by mouth 2 (two) times daily. 08/13/22   McVey, Prudencio Pair, CNM     Prenatal care site:  [facility]  OB History  Gravida Para Term Preterm AB Living  2 1 1  0 0 1  SAB IAB Ectopic Multiple Live Births  0 0 0 1 1    # Outcome Date GA Lbr Len/2nd Weight Sex Delivery Anes PTL Lv  2A Gravida           2B Current           1 Term 05/23/21 [redacted]w[redacted]d 06:04 / 00:44 3990 g F Vag-Spont EPI  LIV     Name: Stodghill,GIRL Kodee     Apgar1: 8  Apgar5: 9  Social History: She  reports that she has never smoked. She has never used smokeless tobacco. She reports current alcohol use. She reports that she does not use drugs.  Family History: family history includes Cancer in her father.   Review of Systems: A full review of systems was performed and negative except as noted in the HPI.     Physical Exam:  Vital Signs: Ht 5\' 9"  (1.753 m)   Wt 108.9 kg   LMP 06/08/2022   BMI 35.44 kg/m   General: no acute distress.  HEENT: normocephalic, atraumatic Heart: regular rate & rhythm Lungs: normal respiratory  effort Abdomen: soft, gravid, non-tender;   Pelvic:   External: Normal external female genitalia  Cervix: Dilation: 5.5 / Effacement (%): 80 / Station: -2    Extremities: non-tender, symmetric, no edema bilaterally.  DTRs: +2  Neurologic: Alert & oriented x 3.    Results for orders placed or performed during the hospital encounter of 03/04/23 (from the past 24 hour(s))  CBC     Status: Abnormal   Collection Time: 03/04/23  4:59 AM  Result Value Ref Range   WBC 9.7 4.0 - 10.5 K/uL   RBC 4.15 3.87 - 5.11 MIL/uL   Hemoglobin 11.1 (L) 12.0 - 15.0 g/dL   HCT 16.1 (L) 09.6 - 04.5 %   MCV 81.4 80.0 - 100.0 fL   MCH 26.7 26.0 - 34.0 pg   MCHC 32.8 30.0 - 36.0 g/dL   RDW 40.9 81.1 - 91.4 %   Platelets 200 150 - 400 K/uL   nRBC 0.0 0.0 - 0.2 %  Type and screen [facility]     Status: None   Collection Time: 03/04/23  4:59 AM  Result Value Ref Range   ABO/RH(D) A POS    Antibody Screen NEG    Sample Expiration      03/07/2023,2359 Performed at [facility], [address], [place], [place] [address]     Pertinent Results:  Prenatal Labs: Blood type/Rh A POS   Antibody screen Negative    Rubella immune    Varicella Immune  RPR NR    HBsAg Neg   Hep C NR   HIV NR    GC neg  Chlamydia neg  Genetic screening cfDNA negative   1 hour GTT 137  3 hour GTT [phone]  GBS neg     FHT:  FHR: 145 bpm, variability: moderate,  accelerations:  Present,  decelerations:  Present mild variables Category/reactivity:  Category II UC:   regular, every 2-5 minutes   Cephalic by Leopolds and SVE   No results found.  Assessment:  Carol Frye is a 32 y.o. G59P1001 female at [redacted]w[redacted]d with active labor, contracting every 2-5 mins. Normal pregnancy.   Plan:  1. Admit to Labor & Delivery - consents reviewed and obtained - Dr. Dalbert Garnet notified of admission and plan of care   2. Fetal Well being  - Fetal Tracing: category 2 - Group B Streptococcus ppx  not indicated: GBS negative - Presentation: cephalic confirmed by sve   3. Routine OB: - Prenatal labs reviewed, as above - Rh positive - CBC, T&S, RPR on admit - Clear liquid diet , continuous IV fluids  4. Monitoring of labor  - Contractions monitored with external toco - Pelvis proven to 3990 g  - Plan for expectant management  - Plan for  continuous fetal monitoring - Maternal pain control as desired; planning regional anesthesia - Anticipate vaginal delivery  5. Post Partum Planning: - Infant feeding: breast feeding - Contraception: IUD - Tdap vaccine: Given prenatally - Flu vaccine: Given prenatally  Lillybeth Tal Wonda Amis, CNM 03/04/23 6:42 AM  Chari Manning, CNM Certified Nurse Midwife [facility]

## 2023-03-04 NOTE — Discharge Summary (Signed)
Obstetrical Discharge Summary  Patient Name: Carol Frye DOB: 1991-06-28 MRN: [id number]  Date of Admission: 03/04/2023 Date of Delivery: 03/04/23 Delivered by: Chari Manning, CNM  Date of Discharge: 03/05/2023  Primary OB: [facility] [id number] last menstrual period was 06/08/2022. EDC Estimated Date of Delivery: 03/15/23 Gestational Age at Delivery: [redacted]w[redacted]d   Antepartum complications:  Obesity Family history of congenital heart defect Adnexal Mass  MFM appt 11/03/22 MFM recommendations against surgical management of this mass in pregnancy, which is in concordance with [facility] recommendations, as long as it remains stable and continues to seem benign  Last MRI 11/01/22 Mass/collection in the right lower quadrant of the abdomen has resolved. There is much less inflammation. Recommends repeat an MRI after delivery.  Elevated 1 hr gtt, normal 3 hour  Admitting Diagnosis: Normal labor and delivery [O80]  Secondary Diagnosis: Patient Active Problem List   Diagnosis Date Noted   Normal labor and delivery 03/04/2023   Abdominal cramping affecting pregnancy 02/13/2023   Encounter for supervision of other normal pregnancy, third trimester 08/28/2022   Hyperemesis gravidarum to [redacted] weeks gestation, electrolyte imbalance 08/11/2022   Colonic mass 08/11/2022   Abdominal pain, chronic, right lower quadrant 08/11/2022   Right lower quadrant abdominal mass 08/11/2022   Indication for care in labor or delivery 05/23/2021   Uterine contractions 05/02/2021   Amniotic fluid leaking 04/30/2021   Pregnancy 04/30/2021    Discharge Diagnosis: Term Pregnancy Delivered      Augmentation: N/A Complications: None Intrapartum complications/course: Carol Frye presented to L&D in active labor She was 5.5/80/-2. She progressed  to C/C/+2 with a spontaneous urge to push.  She pushed  effectively over approximately 6 minutes for a spontaneous vaginal birth.  Delivery Type: spontaneous vaginal  delivery Anesthesia: epidural anesthesia Placenta: spontaneous To Pathology: No  Laceration: 2nd degree and perineal Episiotomy: none Newborn Data: Live born female "Theora Gianotti" Birth Weight:  8lb 11.3oz APGAR: 8, 9  Newborn Delivery   Birth date/time: 03/04/2023 06:50:00 Delivery type: Vaginal, Spontaneous      Postpartum Procedures: none Edinburgh:     03/04/2023   11:27 PM 05/23/2021    6:09 PM  Edinburgh Postnatal Depression Scale Screening Tool  I have been able to laugh and see the funny side of things. 0 0  I have looked forward with enjoyment to things. 0 0  I have blamed myself unnecessarily when things went wrong. 0 1  I have been anxious or worried for no good reason. 1 0  I have felt scared or panicky for no good reason. 0 0  Things have been getting on top of me. 1 0  I have been so unhappy that I have had difficulty sleeping. 0 0  I have felt sad or miserable. 1 0  I have been so unhappy that I have been crying. 0 0  The thought of harming myself has occurred to me. 0 0  Edinburgh Postnatal Depression Scale Total 3 1     Post partum course:   Patient had an uncomplicated postpartum course.  By time of discharge on PPD#1, her pain was controlled on oral pain medications; she had appropriate lochia and was ambulating, voiding without difficulty and tolerating regular diet.  She was deemed stable for discharge to home.       Discharge Physical Exam:   BP 128/71 (BP Location: Right Arm)   Pulse 73   Temp 98 F (36.7 C) (Oral)   Resp 20   Ht 5\' 9"  (1.753 m)  Wt 108.9 kg   LMP 06/08/2022   SpO2 99%   Breastfeeding Unknown   BMI 35.44 kg/m   General: NAD CV: RRR Pulm: CTABL, nl effort ABD: s/nd/nt, fundus firm and below the umbilicus Lochia: moderate Perineum: minimal edema/repair well approximated DVT Evaluation: LE non-ttp, no evidence of DVT on exam.  Hemoglobin  Date Value Ref Range Status  03/05/2023 9.9 (L) 12.0 - 15.0 g/dL Final   HCT  Date  Value Ref Range Status  03/05/2023 30.8 (L) 36.0 - 46.0 % Final    Risk assessment for postpartum VTE and prophylactic treatment: Very high risk factors: None High risk factors: None Moderate risk factors: BMI 30-40 kg/m2  Postpartum VTE prophylaxis with LMWH not indicated  Disposition: stable, discharge to home. Baby Feeding: breast feeding and formula feeding Baby Disposition: home with mom  Rh Immune globulin indicated: No Rubella vaccine given: was not indicated Varivax vaccine given: was not indicated Flu vaccine given in AP setting: Yes  Tdap vaccine given in AP setting: Yes   Contraception: IUD (Mirena)  Prenatal Labs:   Blood type/Rh A POS   Antibody screen Negative    Rubella immune    Varicella Immune  RPR NR    HBsAg Neg   Hep C NR   HIV NR    GC neg  Chlamydia neg  Genetic screening cfDNA negative   1 hour GTT 137  3 hour GTT [phone]  GBS neg       Plan:  Carol Frye was discharged to home in good condition. Follow-up appointment with delivering provider in 6 weeks.  Discharge Medications: Allergies as of 03/05/2023       Reactions   Sulfa Antibiotics    Throat swelling   Mushroom Extract Complex Rash        Medication List     TAKE these medications    acetaminophen 500 MG tablet Commonly known as: TYLENOL Take 500 mg by mouth every 6 (six) hours as needed for mild pain, moderate pain, headache or fever.   benzocaine-Menthol 20-0.5 % Aero Commonly known as: DERMOPLAST Apply 1 Application topically as needed for irritation (perineal discomfort).   dibucaine 1 % Oint Commonly known as: NUPERCAINAL Place 1 Application rectally as needed for hemorrhoids (if tucks not working).   ibuprofen 600 MG tablet Commonly known as: ADVIL Take 1 tablet (600 mg total) by mouth every 6 (six) hours as needed for mild pain, moderate pain or cramping.   prenatal multivitamin Tabs tablet Take 1 tablet by mouth daily at 12 noon.    pyridOXINE 25 MG tablet Commonly known as: VITAMIN B6 Take 1 tablet (25 mg total) by mouth 2 (two) times daily.   simethicone 80 MG chewable tablet Commonly known as: MYLICON Chew 1 tablet (80 mg total) by mouth every 6 (six) hours as needed for flatulence.   witch hazel-glycerin pad Commonly known as: TUCKS Apply 1 Application topically as needed for hemorrhoids (for pain).         Follow-up Information     Chari Manning Rolla Plate, CNM Follow up in 6 week(s).   Specialty: Obstetrics Why: pp visit and IUD insertion Contact information: [address] [place] [address] [phone]                 Signed: Janyce Llanos, CNM 03/05/2023 8:43 AM

## 2023-03-05 LAB — CBC
HCT: 30.8 % — ABNORMAL LOW (ref 36.0–46.0)
Hemoglobin: 9.9 g/dL — ABNORMAL LOW (ref 12.0–15.0)
MCH: 26.5 pg (ref 26.0–34.0)
MCHC: 32.1 g/dL (ref 30.0–36.0)
MCV: 82.6 fL (ref 80.0–100.0)
Platelets: 167 10*3/uL (ref 150–400)
RBC: 3.73 MIL/uL — ABNORMAL LOW (ref 3.87–5.11)
RDW: 14.2 % (ref 11.5–15.5)
WBC: 10.3 10*3/uL (ref 4.0–10.5)
nRBC: 0 % (ref 0.0–0.2)

## 2023-03-05 MED ORDER — SIMETHICONE 80 MG PO CHEW: 80.0000 mg | CHEWABLE_TABLET | Freq: Four times a day (QID) | ORAL | 0 refills | Status: AC | PRN

## 2023-03-05 MED ORDER — WITCH HAZEL-GLYCERIN EX PADS: 1.0000 | MEDICATED_PAD | CUTANEOUS | 1 refills | Status: AC | PRN

## 2023-03-05 MED ORDER — DIBUCAINE (PERIANAL) 1 % EX OINT: 1.0000 | TOPICAL_OINTMENT | CUTANEOUS | 1 refills | Status: AC | PRN

## 2023-03-05 MED ORDER — BENZOCAINE-MENTHOL 20-0.5 % EX AERO: 1.0000 | INHALATION_SPRAY | CUTANEOUS | 1 refills | Status: AC | PRN

## 2023-03-05 MED ORDER — IBUPROFEN 600 MG PO TABS: 600.0000 mg | ORAL_TABLET | Freq: Four times a day (QID) | ORAL | 0 refills | Status: AC | PRN

## 2023-03-05 NOTE — Lactation Note (Signed)
This note was copied from a baby's chart. Lactation Consultation Note  Patient Name: Carol Frye [id number] Date: 03/05/2023 Age:30 hours Reason for consult: Follow-up assessment;Early term 37-38.6wks   Maternal Data: This is mom's 2nd baby, SVD. Mom with no pertinent medical history.  On follow-up today mom is bottle feeding formula and pumping. Mom has a pump at home and plans on pumping and providing milk.  Has patient been taught Hand Expression?: Yes Does the patient have breastfeeding experience prior to this delivery?: Yes How long did the patient breastfeed?: pumped X1 year  Feeding Mother's Current Feeding Choice: Breast Milk and Formula   Interventions Interventions: Education  Discharge Discharge Education: Engorgement and breast care;Warning signs for feeding baby;Outpatient recommendation Pump: Personal  Consult Status Consult Status: Complete Date: 03/05/23 Follow-up type: In-patient  Update provided to care nurse.  Fuller Song 03/05/2023, 1:15 PM

## 2023-03-05 NOTE — Anesthesia Postprocedure Evaluation (Signed)
Anesthesia Post Note  Patient: Carol Frye  Procedure(s) Performed: AN AD HOC LABOR EPIDURAL  Patient location during evaluation: Mother Baby Anesthesia Type: Epidural Level of consciousness: awake and alert and oriented Pain management: pain level not controlled Vital Signs Assessment: post-procedure vital signs reviewed and stable Respiratory status: respiratory function stable Cardiovascular status: stable Postop Assessment: no headache, no backache, patient able to bend at knees, no apparent nausea or vomiting, able to ambulate and adequate PO intake Anesthetic complications: no   No notable events documented.   Last Vitals:  Vitals:   03/04/23 2002 03/04/23 2331  BP: 130/80 128/71  Pulse: 81 73  Resp: 20 20  Temp: 36.9 C 36.7 C  SpO2: 99% 99%    Last Pain:  Vitals:   03/05/23 0330  TempSrc:   PainSc: 0-No pain                 Zachary George

## 2023-03-05 NOTE — Progress Notes (Signed)
Patient discharged home with infant. Discharge instructions and prescriptions given and reviewed with patient. Patient verbalized understanding. Escorted out by auxillary.  

## 2023-03-05 NOTE — Discharge Instructions (Signed)
Discharge Instructions:   If there are any new medications, they have been ordered and will be available for pickup at the listed pharmacy on your way home from the hospital.   Call office if you have any of the following: headache, visual changes, fever >101.0 F, chills, shortness of breath, breast concerns, excessive vaginal bleeding, incision drainage or problems, leg pain or redness, depression or any other concerns. If you have vaginal discharge with an odor, let your doctor know.   It is normal to bleed for up to 6 weeks. You should not soak through more than 1 pad in 1 hour. If you have a blood clot larger than your fist with continued bleeding, call your doctor.   Activity: Do not lift > 10 lbs for 6 weeks (do not lift anything heavier than your baby). No intercourse, tampons, swimming pools, hot tubs, baths (only showers) for 6 weeks.  No driving for 1-2 weeks. Continue prenatal vitamin, especially if breastfeeding. Increase calories and fluids (water) while breastfeeding.   Your milk will come in, in the next couple of days (right now it is colostrum). You may have a slight fever when your milk comes in, but it should go away on its own.  If it does not, and rises above 101 F please call the doctor. You will also feel achy and your breasts will be firm. They will also start to leak. If you are breastfeeding, continue as you have been and you can pump/express milk for comfort.   If you have too much milk, your breasts can become engorged, which could lead to mastitis. This is an infection of the milk ducts. It can be very painful and you will need to notify your doctor to obtain a prescription for antibiotics. You can also treat it with a shower or hot/cold compress.   For concerns about your baby, please call your pediatrician.  For breastfeeding concerns, the lactation consultant can be reached at [phone].   Postpartum blues (feelings of happy one minute and sad another minute)  are normal for the first few weeks but if it gets worse let your doctor know.   Congratulations! We enjoyed caring for you and your new bundle of joy!  

## 2023-04-05 ENCOUNTER — Telehealth: Payer: Self-pay

## 2023-04-05 NOTE — Telephone Encounter (Signed)
[facility]- Discharge Call Backs- Spoke to patient on the phone about the following below. 1-Do you have any questions or concerns about yourself as you heal?No 2-Any concerns or questions about your baby?No 3- Reviewed ABC's of safe sleep. 4-How was your stay at the hospital?Great stay. 5- Did our team work together to care for you?Yes You should be receiving a survey in the mail soon.   We would really appreciate it if you could fill that out for [place] and return it in the mail.  We value the feedback to make improvements and continue the great work we do.   If you have any questions please feel free to call me back at [phone]

## 2024-06-11 ENCOUNTER — Ambulatory Visit
Admission: EM | Admit: 2024-06-11 | Discharge: 2024-06-11 | Disposition: A | Discharge status: Home / Self Care | Attending: Emergency Medicine | Admitting: Emergency Medicine

## 2024-06-11 DIAGNOSIS — R600 Localized edema: Secondary | ICD-10-CM | POA: Diagnosis present

## 2024-06-11 LAB — CBC WITH DIFFERENTIAL/PLATELET
Abs Immature Granulocytes: 0.03 K/uL (ref 0.00–0.07)
Basophils Absolute: 0.1 K/uL (ref 0.0–0.1)
Basophils Relative: 0 %
Eosinophils Absolute: 0.2 K/uL (ref 0.0–0.5)
Eosinophils Relative: 2 %
HCT: 38.1 % (ref 36.0–46.0)
Hemoglobin: 13.4 g/dL (ref 12.0–15.0)
Immature Granulocytes: 0 %
Lymphocytes Relative: 29 %
Lymphs Abs: 3.3 K/uL (ref 0.7–4.0)
MCH: 28.6 pg (ref 26.0–34.0)
MCHC: 35.2 g/dL (ref 30.0–36.0)
MCV: 81.2 fL (ref 80.0–100.0)
Monocytes Absolute: 0.7 K/uL (ref 0.1–1.0)
Monocytes Relative: 6 %
Neutro Abs: 7.2 K/uL (ref 1.7–7.7)
Neutrophils Relative %: 63 %
Platelets: 322 K/uL (ref 150–400)
RBC: 4.69 MIL/uL (ref 3.87–5.11)
RDW: 12.4 % (ref 11.5–15.5)
WBC: 11.5 K/uL — ABNORMAL HIGH (ref 4.0–10.5)
nRBC: 0 % (ref 0.0–0.2)

## 2024-06-11 LAB — COMPREHENSIVE METABOLIC PANEL WITH GFR
ALT: 12 U/L (ref 0–44)
AST: 16 U/L (ref 15–41)
Albumin: 4.1 g/dL (ref 3.5–5.0)
Alkaline Phosphatase: 85 U/L (ref 38–126)
Anion gap: 10 (ref 5–15)
BUN: 11 mg/dL (ref 6–20)
CO2: 21 mmol/L — ABNORMAL LOW (ref 22–32)
Calcium: 9.1 mg/dL (ref 8.9–10.3)
Chloride: 102 mmol/L (ref 98–111)
Creatinine, Ser: 0.71 mg/dL (ref 0.44–1.00)
GFR, Estimated: >60 mL/min (ref >60)
Glucose, Bld: 129 mg/dL — ABNORMAL HIGH (ref 70–99)
Potassium: 3.6 mmol/L (ref 3.5–5.1)
Sodium: 133 mmol/L — ABNORMAL LOW (ref 135–145)
Total Bilirubin: 0.6 mg/dL (ref 0.0–1.2)
Total Protein: 7.8 g/dL (ref 6.5–8.1)

## 2024-06-11 NOTE — ED Triage Notes (Signed)
 Patient state sthat she's had swelling in her legs and feet for 48 hrs. Ring is stuck on left ring finger. Patient is unsure whey she's is swollen. Patient state  that this hasn't happened before.

## 2024-06-11 NOTE — ED Provider Notes (Signed)
 [facility]    CSN: [id number] Arrival date & time: 06/11/24  1702      History   Chief Complaint Chief Complaint  Patient presents with   ring stuck   Leg Swelling    HPI Carol Frye is a 33 y.o. female.   HPI  33 year old female with no significant past medical history presents for evaluation of swelling in her extremities that has been off and on for the last week and then over the last 48 hours had become more constant.  Particularly in her hands.  She reports that her wedding band is stuck on her left ring finger and has been for the last 48 hours.  She has tried elevation, ice, and dental floss without success in removing the ring.  She reports that she also has had tingling in her hands, feet, and around her mouth.  No chest pain or shortness of breath.  History reviewed. No pertinent past medical history.  Patient Active Problem List   Diagnosis Date Noted   Normal labor and delivery 03/04/2023   Abdominal cramping affecting pregnancy 02/13/2023   Encounter for supervision of other normal pregnancy, third trimester 08/28/2022   Hyperemesis gravidarum to [redacted] weeks gestation, electrolyte imbalance 08/11/2022   Colonic mass 08/11/2022   Abdominal pain, chronic, right lower quadrant 08/11/2022   Right lower quadrant abdominal mass 08/11/2022   Indication for care in labor or delivery 05/23/2021   Uterine contractions 05/02/2021   Amniotic fluid leaking 04/30/2021   Pregnancy 04/30/2021    Past Surgical History:  Procedure Laterality Date   NO PAST SURGERIES     OTHER SURGICAL HISTORY     patient was put to sleep for suture repair of face after dog attack at age of 7 years   ROBOTIC ASSISTED LAPAROSCOPIC OVARIAN CYSTECTOMY N/A 08/11/2022   Procedure: XI ROBOTIC ASSISTED DIAGNOSTIC LAPAROSCOPY;  Surgeon: Leonce Garnette BIRCH, MD;  Location: [facility] ORS;  Service: Gynecology;  Laterality: N/A;    OB History     Gravida  2   Para  2   Term  2    Preterm      AB      Living  2      SAB      IAB      Ectopic      Multiple  0   Live Births  2            Home Medications    Prior to Admission medications   Medication Sig Start Date End Date Taking? Authorizing Provider  acetaminophen  (TYLENOL ) 500 MG tablet Take 500 mg by mouth every 6 (six) hours as needed for mild pain, moderate pain, headache or fever.    [provider]  benzocaine -Menthol  (DERMOPLAST) 20-0.5 % AERO Apply 1 Application topically as needed for irritation (perineal discomfort). 03/05/23   Tanda Edsel Fuller, CNM  dibucaine (NUPERCAINAL) 1 % OINT Place 1 Application rectally as needed for hemorrhoids (if tucks not working). 03/05/23   Tanda Edsel Fuller, CNM  ibuprofen  (ADVIL ) 600 MG tablet Take 1 tablet (600 mg total) by mouth every 6 (six) hours as needed for mild pain, moderate pain or cramping. 03/05/23   Tanda Edsel Fuller, CNM  Prenatal Vit-Fe Fumarate-FA (PRENATAL MULTIVITAMIN) TABS tablet Take 1 tablet by mouth daily at 12 noon.    [provider]  pyridOXINE  (VITAMIN B6) 25 MG tablet Take 1 tablet (25 mg total) by mouth 2 (two) times daily. 08/13/22   McVey,  [place]berry LABOR, CNM  simethicone  (MYLICON) 80 MG chewable tablet Chew 1 tablet (80 mg total) by mouth every 6 (six) hours as needed for flatulence. 03/05/23   Tanda Edsel Fuller, CNM  witch hazel-glycerin  (TUCKS) pad Apply 1 Application topically as needed for hemorrhoids (for pain). 03/05/23   Tanda Edsel Fuller, CNM    Family History Family History  Problem Relation Age of Onset   Cancer Father     Social History Social History   Tobacco Use   Smoking status: Never   Smokeless tobacco: Never  Vaping Use   Vaping status: Never Used  Substance Use Topics   Alcohol use: Yes   Drug use: Never     Allergies   Sulfa antibiotics and Mushroom extract complex (obsolete)   Review of Systems Review of Systems  Respiratory:  Negative for shortness of  breath.   Cardiovascular:  Positive for leg swelling. Negative for chest pain and palpitations.  Neurological:        Tingling in her extremities and circumorally.     Physical Exam Triage Vital Signs ED Triage Vitals  Encounter Vitals Group     BP      Girls Systolic BP Percentile      Girls Diastolic BP Percentile      Boys Systolic BP Percentile      Boys Diastolic BP Percentile      Pulse      Resp      Temp      Temp src      SpO2      Weight      Height      Head Circumference      Peak Flow      Pain Score      Pain Loc      Pain Education      Exclude from Growth Chart    No data found.  Updated Vital Signs BP 128/83 (BP Location: Right Arm)   Pulse 81   Temp 98.3 F (36.8 C) (Oral)   Resp 18   Wt 220 lb (99.8 kg)   SpO2 100%   BMI 32.49 kg/m   Visual Acuity Right Eye Distance:   Left Eye Distance:   Bilateral Distance:    Right Eye Near:   Left Eye Near:    Bilateral Near:     Physical Exam Vitals and nursing note reviewed.  Constitutional:      Appearance: Normal appearance. She is not ill-appearing.  HENT:     Head: Normocephalic and atraumatic.  Cardiovascular:     Rate and Rhythm: Normal rate and regular rhythm.     Pulses: Normal pulses.     Heart sounds: Normal heart sounds. No murmur heard.    No friction rub. No gallop.  Pulmonary:     Effort: Pulmonary effort is normal.     Breath sounds: Normal breath sounds. No wheezing, rhonchi or rales.  Musculoskeletal:     Right lower leg: No edema.     Left lower leg: No edema.  Skin:    General: Skin is warm and dry.     Capillary Refill: Capillary refill takes less than 2 seconds.  Neurological:     General: No focal deficit present.     Mental Status: She is alert and oriented to person, place, and time.      UC Treatments / Results  Labs (all labs ordered are listed, but only abnormal results are displayed) Labs Reviewed  CBC WITH  DIFFERENTIAL/PLATELET - Abnormal; Notable  for the following components:      Result Value   WBC 11.5 (*)    All other components within normal limits  COMPREHENSIVE METABOLIC PANEL WITH GFR - Abnormal; Notable for the following components:   Sodium 133 (*)    CO2 21 (*)    Glucose, Bld 129 (*)    All other components within normal limits    EKG Sinus rhythm with ventricular rate of 84 bpm PR interval 168 ms QRS duration 84 ms QT/QTc 364/430 ms No ST or T wave abnormalities noted.  Radiology No results found.  Procedures Procedures (including critical care time)  Medications Ordered in UC Medications - No data to display  Initial Impression / Assessment and Plan / UC Course  I have reviewed the triage vital signs and the nursing notes.  Pertinent labs & imaging results that were available during my care of the patient were reviewed by me and considered in my medical decision making (see chart for details).   Patient is a pleasant, nontoxic-appearing 33 year old female presenting for evaluation of facial swelling and swelling in her extremities as outlined HPI above.  In the exam room the patient swelling has largely resolved with the exception of in the proximal phalanx of her left ring finger.  This is preventing the ring from sliding off.  Her distal extremity is pink and warm with a cap refill less than 2 seconds.  Using a ring cutter and bolt cutters I was able to remove the ring successfully.  At present, the patient does not exhibit any other additional swelling.  Her DP and PT pulses are 2+ and her radial ulnar pulses are also 2+ bilaterally.  Cardiopulmonary exam reveals S1-S2 heart sounds with regular rate and rhythm and lung sounds are clear to auscultation all fields.  The patient does not take any medications and has not been on any recent steroids.  She reports that she does not eat a lot of salt and she does not eat out frequently.  She has had no chest pain or shortness of breath.  She reports that sometimes  when she gets hot she will get swelling in her extremities, but that is normal.  The etiology of her swelling is unclear.  I will obtain EKG to evaluate for any cardiac component which could be contributing as well as check a CBC and CMP to assess for any electrolyte abnormality, alterations in renal function, or infection which could be contributing.  The tingling in the patient's extremities and circumorally is unclear as she denies any new exposures, medications, supplements, changes in personal hygiene products or cosmetics.  She denies any swelling of her lips, tongue, tightness in her throat.  Patient's EKG shows normal sinus rhythm without any T wave or ST abnormalities noted.  No other tracings available for comparison in epic.  CBC shows a mildly elevated white count of 11.5.  H&H is normal at 13.4 and 38.1 respectively.  Platelets normal at 322.  No abnormalities to the differential.  CMP shows mild hyponatremia with a sodium of 133, potassium and chloride are normal.  CO2 is mildly decreased at 21.  Renal function is normal as are transaminases.  The etiology of the patient swelling is unclear though it may be related to her hyponatremia.  I will have her increase her salt intake to see if it improves her symptoms.  I will also have her treat the edema in her legs with compression hose and  elevation to see if this improves her symptoms.   Final Clinical Impressions(s) / UC Diagnoses   Final diagnoses:  Peripheral edema     Discharge Instructions      Your blood work was reassuring though it does show that you have a mildly low sodium.  This may be contributing to your swelling.  Increase your salt intake over the next several days to help your body hold onto more water intravascularly and see if your symptoms improve.  For the swelling in your legs you may also wear compression hose, compression socks, and elevate them as often as possible to help decrease the swelling.  If  these measures do not improve your symptoms I recommend you follow-up with your primary care provider.     ED Prescriptions   None    PDMP not reviewed this encounter.   Bernardino Ditch, NP 06/11/24 [phone]

## 2024-06-11 NOTE — Discharge Instructions (Addendum)
 Your blood work was reassuring though it does show that you have a mildly low sodium.  This may be contributing to your swelling.  Increase your salt intake over the next several days to help your body hold onto more water intravascularly and see if your symptoms improve.  For the swelling in your legs you may also wear compression hose, compression socks, and elevate them as often as possible to help decrease the swelling.  If these measures do not improve your symptoms I recommend you follow-up with your primary care provider.
